# Patient Record
Sex: Male | Born: 1995 | Race: White | Hispanic: No | Marital: Single | Smoking: Former smoker
Health system: Southern US, Community
[De-identification: ages and names within clinical notes are randomized; demographics above are authoritative.]

## PROBLEM LIST (undated history)

## (undated) DIAGNOSIS — J329 Chronic sinusitis, unspecified: Secondary | ICD-10-CM

## (undated) DIAGNOSIS — F988 Other specified behavioral and emotional disorders with onset usually occurring in childhood and adolescence: Secondary | ICD-10-CM

## (undated) DIAGNOSIS — K089 Disorder of teeth and supporting structures, unspecified: Secondary | ICD-10-CM

## (undated) DIAGNOSIS — R519 Headache, unspecified: Secondary | ICD-10-CM

## (undated) DIAGNOSIS — M2420 Disorder of ligament, unspecified site: Secondary | ICD-10-CM

## (undated) DIAGNOSIS — K439 Ventral hernia without obstruction or gangrene: Secondary | ICD-10-CM

## (undated) HISTORY — DX: Other specified behavioral and emotional disorders with onset usually occurring in childhood and adolescence: F98.8

## (undated) HISTORY — PX: MOUTH SURGERY: SHX715

## (undated) HISTORY — DX: Chronic sinusitis, unspecified: J32.9

## (undated) HISTORY — DX: Ventral hernia without obstruction or gangrene: K43.9

## (undated) HISTORY — PX: PR ORAL SURGERY PROCEDURE: D7999

## (undated) HISTORY — DX: Disorder of teeth and supporting structures, unspecified: K08.9

## (undated) HISTORY — DX: Headache, unspecified: R51.9

## (undated) HISTORY — DX: Disorder of ligament, unspecified site: M24.20

## (undated) HISTORY — PX: HERNIA REPAIR: SHX5222

## (undated) DEATH — deceased

---
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
## 2016-10-17 ENCOUNTER — Emergency Department (HOSPITAL_COMMUNITY): Payer: Managed Care, Other (non HMO)

## 2016-10-17 ENCOUNTER — Emergency Department (HOSPITAL_COMMUNITY)
Admission: EM | Admit: 2016-10-17 | Discharge: 2016-10-17 | Disposition: A | Discharge status: Home / Self Care | Payer: Managed Care, Other (non HMO) | Attending: Emergency Medicine | Admitting: Emergency Medicine

## 2016-10-17 ENCOUNTER — Encounter (HOSPITAL_COMMUNITY): Payer: Self-pay | Admitting: Emergency Medicine

## 2016-10-17 DIAGNOSIS — Z79899 Other long term (current) drug therapy: Secondary | ICD-10-CM | POA: Insufficient documentation

## 2016-10-17 DIAGNOSIS — M25511 Pain in right shoulder: Secondary | ICD-10-CM | POA: Diagnosis not present

## 2016-10-17 DIAGNOSIS — M545 Low back pain: Secondary | ICD-10-CM | POA: Insufficient documentation

## 2016-10-17 DIAGNOSIS — Y999 Unspecified external cause status: Secondary | ICD-10-CM | POA: Insufficient documentation

## 2016-10-17 DIAGNOSIS — Z87891 Personal history of nicotine dependence: Secondary | ICD-10-CM | POA: Insufficient documentation

## 2016-10-17 DIAGNOSIS — F10120 Alcohol abuse with intoxication, uncomplicated: Secondary | ICD-10-CM | POA: Diagnosis not present

## 2016-10-17 DIAGNOSIS — Y939 Activity, unspecified: Secondary | ICD-10-CM | POA: Diagnosis not present

## 2016-10-17 DIAGNOSIS — R930 Abnormal findings on diagnostic imaging of skull and head, not elsewhere classified: Secondary | ICD-10-CM | POA: Diagnosis not present

## 2016-10-17 DIAGNOSIS — Y92410 Unspecified street and highway as the place of occurrence of the external cause: Secondary | ICD-10-CM | POA: Insufficient documentation

## 2016-10-17 DIAGNOSIS — Z5181 Encounter for therapeutic drug level monitoring: Secondary | ICD-10-CM | POA: Insufficient documentation

## 2016-10-17 DIAGNOSIS — M25561 Pain in right knee: Secondary | ICD-10-CM | POA: Diagnosis not present

## 2016-10-17 DIAGNOSIS — F10920 Alcohol use, unspecified with intoxication, uncomplicated: Secondary | ICD-10-CM

## 2016-10-17 LAB — I-STAT CHEM 8, ED
BUN: 15 mg/dL (ref 6–20)
BUN: 17 mg/dL (ref 6–20)
CALCIUM ION: 1.13 mmol/L — AB (ref 1.15–1.40)
CHLORIDE: 107 mmol/L (ref 101–111)
CREATININE: 1.2 mg/dL (ref 0.61–1.24)
CREATININE: 1.4 mg/dL — AB (ref 0.61–1.24)
Calcium, Ion: 1.1 mmol/L — ABNORMAL LOW (ref 1.15–1.40)
Chloride: 109 mmol/L (ref 101–111)
GLUCOSE: 102 mg/dL — AB (ref 65–99)
GLUCOSE: 105 mg/dL — AB (ref 65–99)
HCT: 41 % (ref 39.0–52.0)
HEMATOCRIT: 38 % — AB (ref 39.0–52.0)
Hemoglobin: 12.9 g/dL — ABNORMAL LOW (ref 13.0–17.0)
Hemoglobin: 13.9 g/dL (ref 13.0–17.0)
POTASSIUM: 3.9 mmol/L (ref 3.5–5.1)
POTASSIUM: 4.1 mmol/L (ref 3.5–5.1)
Sodium: 145 mmol/L (ref 135–145)
Sodium: 145 mmol/L (ref 135–145)
TCO2: 23 mmol/L (ref 0–100)
TCO2: 24 mmol/L (ref 0–100)

## 2016-10-17 LAB — COMPREHENSIVE METABOLIC PANEL
ALK PHOS: 51 U/L (ref 38–126)
ALT: 15 U/L — AB (ref 17–63)
ANION GAP: 13 (ref 5–15)
AST: 20 U/L (ref 15–41)
Albumin: 4.4 g/dL (ref 3.5–5.0)
BILIRUBIN TOTAL: 0.6 mg/dL (ref 0.3–1.2)
BUN: 14 mg/dL (ref 6–20)
CALCIUM: 9.7 mg/dL (ref 8.9–10.3)
CO2: 21 mmol/L — AB (ref 22–32)
CREATININE: 1.07 mg/dL (ref 0.61–1.24)
Chloride: 109 mmol/L (ref 101–111)
Glucose, Bld: 105 mg/dL — ABNORMAL HIGH (ref 65–99)
Potassium: 4 mmol/L (ref 3.5–5.1)
Sodium: 143 mmol/L (ref 135–145)
TOTAL PROTEIN: 7.3 g/dL (ref 6.5–8.1)

## 2016-10-17 LAB — CBC
HCT: 40.4 % (ref 39.0–52.0)
Hemoglobin: 14.4 g/dL (ref 13.0–17.0)
MCH: 31.3 pg (ref 26.0–34.0)
MCHC: 35.6 g/dL (ref 30.0–36.0)
MCV: 87.8 fL (ref 78.0–100.0)
PLATELETS: 207 10*3/uL (ref 150–400)
RBC: 4.6 MIL/uL (ref 4.22–5.81)
RDW: 12.1 % (ref 11.5–15.5)
WBC: 6.5 10*3/uL (ref 4.0–10.5)

## 2016-10-17 LAB — PROTIME-INR
INR: 1.05
Prothrombin Time: 13.7 seconds (ref 11.4–15.2)

## 2016-10-17 LAB — I-STAT CG4 LACTIC ACID, ED
LACTIC ACID, VENOUS: 2.93 mmol/L — AB (ref 0.5–1.9)
Lactic Acid, Venous: 1.77 mmol/L (ref 0.5–1.9)

## 2016-10-17 LAB — SAMPLE TO BLOOD BANK

## 2016-10-17 LAB — ETHANOL: ALCOHOL ETHYL (B): 234 mg/dL — AB (ref <5)

## 2016-10-17 MED ORDER — SODIUM CHLORIDE 0.9 % IV BOLUS (SEPSIS)
1000.0000 mL | Freq: Once | INTRAVENOUS | Status: AC
  Administered 2016-10-17: 1000 mL via INTRAVENOUS

## 2016-10-17 MED ORDER — IBUPROFEN 800 MG PO TABS: 800.0000 mg | ORAL_TABLET | Freq: Three times a day (TID) | ORAL | 0 refills | Status: DC

## 2016-10-17 MED ORDER — SODIUM CHLORIDE 0.9 % IV SOLN: INTRAVENOUS | Status: DC

## 2016-10-17 MED ORDER — CYCLOBENZAPRINE HCL 10 MG PO TABS: 5.0000 mg | ORAL_TABLET | Freq: Two times a day (BID) | ORAL | 0 refills | Status: DC | PRN
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
## 2016-10-17 NOTE — ED Notes (Signed)
Patient transported to X-ray 
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
## 2018-11-27 ENCOUNTER — Encounter: Payer: Self-pay | Admitting: Family Medicine

## 2018-11-27 NOTE — Progress Notes (Deleted)
   Subjective:    Patient ID: Sean Roberts, male    DOB: 05/05/1996, 23 y.o.   MRN: [id number]  HPI No chief complaint on file.  He is new to the practice and here for a complete physical exam. Previous medical care: Last CPE:  Other providers:  Past medical history: Surgeries:  Family history: Mental health history:  Social history: Lives with ***, works as ***,  *** Smoking, drinking alcohol, drug use Diet: *** Exercise: ***  Immunizations:  Health maintenance:  Colonoscopy: Last PSA: Last Dental Exam: Last Eye Exam:  Wears seatbelt always, uses sunscreen, smoke detectors in home and functioning, does not text while driving, feels safe in home environment.  Reviewed allergies, medications, past medical, surgical, family, and social history.   Review of Systems Review of Systems Constitutional: -fever, -chills, -sweats, -unexpected weight change,-fatigue ENT: -runny nose, -ear pain, -sore throat Cardiology:  -chest pain, -palpitations, -edema Respiratory: -cough, -shortness of breath, -wheezing Gastroenterology: -abdominal pain, -nausea, -vomiting, -diarrhea, -constipation  Hematology: -bleeding or bruising problems Musculoskeletal: -arthralgias, -myalgias, -joint swelling, -back pain Ophthalmology: -vision changes Urology: -dysuria, -difficulty urinating, -hematuria, -urinary frequency, -urgency Neurology: -headache, -weakness, -tingling, -numbness       Objective:   Physical Exam There were no vitals taken for this visit.  General Appearance:    Alert, cooperative, no distress, appears stated age  Head:    Normocephalic, without obvious abnormality, atraumatic  Eyes:    PERRL, conjunctiva/corneas clear, EOM's intact, fundi    benign  Ears:    Normal TM's and external ear canals  Nose:   Nares normal, mucosa normal, no drainage or sinus   tenderness  Throat:   Lips, mucosa, and tongue normal; teeth and gums normal  Neck:   Supple, no lymphadenopathy;   thyroid:  no   enlargement/tenderness/nodules; no carotid   bruit or JVD  Back:    Spine nontender, no curvature, ROM normal, no CVA     tenderness  Lungs:     Clear to auscultation bilaterally without wheezes, rales or     ronchi; respirations unlabored  Chest Wall:    No tenderness or deformity   Heart:    Regular rate and rhythm, S1 and S2 normal, no murmur, rub   or gallop  Breast Exam:    No chest wall tenderness, masses or gynecomastia  Abdomen:     Soft, non-tender, nondistended, normoactive bowel sounds,    no masses, no hepatosplenomegaly  Genitalia:    Normal male external genitalia without lesions.  Testicles without masses.  No inguinal hernias.  Rectal:   Deferred due to age <40 and lack of symptoms  Extremities:   No clubbing, cyanosis or edema  Pulses:   2+ and symmetric all extremities  Skin:   Skin color, texture, turgor normal, no rashes or lesions  Lymph nodes:   Cervical, supraclavicular, and axillary nodes normal  Neurologic:   CNII-XII intact, normal strength, sensation and gait; reflexes 2+ and symmetric throughout          Psych:   Normal mood, affect, hygiene and grooming.          Assessment & Plan:  Routine general medical examination at a health care facility

## 2018-12-04 ENCOUNTER — Encounter: Payer: Self-pay | Admitting: Family Medicine

## 2018-12-04 ENCOUNTER — Ambulatory Visit (INDEPENDENT_AMBULATORY_CARE_PROVIDER_SITE_OTHER): Payer: Managed Care, Other (non HMO) | Admitting: Family Medicine

## 2018-12-04 VITALS — BP 112/76 | HR 80 | Temp 98.0°F | Ht 73.0 in | Wt 152.8 lb

## 2018-12-04 DIAGNOSIS — F988 Other specified behavioral and emotional disorders with onset usually occurring in childhood and adolescence: Secondary | ICD-10-CM | POA: Diagnosis not present

## 2018-12-04 DIAGNOSIS — Z0000 Encounter for general adult medical examination without abnormal findings: Secondary | ICD-10-CM

## 2018-12-04 DIAGNOSIS — M5431 Sciatica, right side: Secondary | ICD-10-CM

## 2018-12-04 DIAGNOSIS — J329 Chronic sinusitis, unspecified: Secondary | ICD-10-CM

## 2018-12-04 LAB — POCT URINALYSIS DIP (PROADVANTAGE DEVICE)
Bilirubin, UA: NEGATIVE
Blood, UA: NEGATIVE
Glucose, UA: NEGATIVE mg/dL
Ketones, POC UA: NEGATIVE mg/dL
Leukocytes, UA: NEGATIVE
Nitrite, UA: NEGATIVE
PH UA: 6 (ref 5.0–8.0)
Protein Ur, POC: NEGATIVE mg/dL
Specific Gravity, Urine: 1.01
Urobilinogen, Ur: 3.5

## 2018-12-04 NOTE — Progress Notes (Signed)
Subjective:    Patient ID: Sean Roberts, male    DOB: 1996-02-28, 23 y.o.   MRN: [id number]  HPI Chief Complaint  Patient presents with  . other    non fasting CPE   He is new to the practice and here for a complete physical exam. Previous medical care: Dr. Abigail Miyamoto at [facility].  Last CPE: 2 years ago  Other providers: Dentist   ADD- diagnosed as a junior in [employer]. Has been taking Adderall 10 mg once daily 2-3 days per week.  No prescriptions in the past year. Only takes it as needed.  He has issues with eating and sleeping.   Takes Claritin in the morning. Afrin some days.   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  Smokes marijuana daily, drinks alcohol.  Diet: fairly healthy  Exercise: 3 days per week  Immunizations: Tdap UTD per patient. Declines flu Gardasil done   Health maintenance:  Last Dental Exam: twice annually  Last Eye Exam: years ago   Wears seatbelt always, uses sunscreen, smoke detectors in home and functioning, does not text while driving, feels safe in home environment.  Reviewed allergies, medications, past medical, surgical, family, and social history.   Review of Systems Review of Systems Constitutional: -fever, -chills, -sweats, -unexpected weight change,-fatigue ENT: -runny nose, -ear pain, -sore throat Cardiology:  -chest pain, -palpitations, -edema Respiratory: -cough, -shortness of breath, -wheezing Gastroenterology: -abdominal pain, -nausea, -vomiting, -diarrhea, -constipation  Hematology: -bleeding or bruising problems Musculoskeletal: -arthralgias, -myalgias, -joint swelling, -back pain Ophthalmology: -vision changes Urology: -dysuria, -difficulty urinating, -hematuria, -urinary frequency, -urgency Neurology: -headache, -weakness, -tingling, -numbness       Objective:   Physical Exam BP 112/76 (BP Location: Left Arm, Patient Position: Sitting)   Pulse 80   Temp 98 F (36.7 C)   Ht 6\' 1"   (1.854 m)   Wt 152 lb 12.8 oz (69.3 kg)   SpO2 97%   BMI 20.16 kg/m   General Appearance:    Alert, cooperative, no distress, appears stated age  Head:    Normocephalic, without obvious abnormality, atraumatic  Eyes:    PERRL, conjunctiva/corneas clear, EOM's intact, fundi    benign  Ears:    Normal TM's and external ear canals  Nose:   Nares normal, mucosa normal, no drainage or sinus   tenderness  Throat:   Lips, mucosa, and tongue normal; teeth and gums normal  Neck:   Supple, no lymphadenopathy;  thyroid:  no   enlargement/tenderness/nodules; no carotid   bruit or JVD  Back:    Spine nontender, no curvature, ROM normal, no CVA     Tenderness.  He does have tenderness over his right sciatic notch.  Lungs:     Clear to auscultation bilaterally without wheezes, rales or     ronchi; respirations unlabored  Chest Wall:    No tenderness or deformity   Heart:    Regular rate and rhythm, S1 and S2 normal, no murmur, rub   or gallop  Breast Exam:    No chest wall tenderness, masses or gynecomastia  Abdomen:     Soft, non-tender, nondistended, normoactive bowel sounds,    no masses, no hepatosplenomegaly  Genitalia:    Normal male external genitalia without lesions.  Testicles without masses.  No inguinal hernias.  Rectal:   Deferred due to age <40 and lack of symptoms  Extremities:   No clubbing, cyanosis or edema  Pulses:   2+ and symmetric all extremities  Skin:   Skin color, texture, turgor normal, no rashes or lesions  Lymph nodes:   Cervical, supraclavicular, and axillary nodes normal  Neurologic:   CNII-XII intact, normal strength, sensation and gait; reflexes 2+ and symmetric throughout          Psych:   Normal mood, affect, hygiene and grooming.     Urinalysis dipstick: Negative      Assessment & Plan:  Routine general medical examination at a health care facility  Attention deficit disorder, unspecified hyperactivity presence  Frequent sinus infections  Sciatica of  right side  Is a pleasant 23 year old male who is new to the practice and here today for a nonfasting CPE.  He declines labs. Counseling on exercises and conservative treatment for sciatica.  He will report back in a couple of weeks if he is not noticing any improvement.  Consider referral to PT at that time. History of ADD.  No records available today to confirm this.  He does not currently need refills.  I will get records from his previous PCP and potentially the counselor who diagnosed him. Immunizations up-to-date per patient.  Nothing on file. He appears to be taking good care of himself by eating a healthy diet and exercising.  I did recommend that he stop smoking marijuana. Declines STD testing. Congratulated him on upcoming graduation in May.  He already has a job in Chief Financial Officer which he is excited about. Recommend self testicular exams and counseling on his increased risk of testicular cancer due to age. Follow-up as needed.

## 2018-12-04 NOTE — Addendum Note (Signed)
Addended by: Renelda Loma on: 12/04/2018 05:38 PM   Modules accepted: Orders

## 2018-12-04 NOTE — Patient Instructions (Addendum)
Try Compound W for the wart on your finger.    Core Strength Exercises  Core exercises help to build strength in the muscles between your ribs and your hips (abdominal muscles). These muscles help to support your body and keep your spine stable. It is important to maintain strength in your core to prevent injury and pain. Some activities, such as yoga and Pilates, can help to strengthen core muscles. You can also strengthen core muscles with exercises at home. It is important to talk to your health care provider before you start a new exercise routine. What are the benefits of core strength exercises? Core strength exercises can:  Reduce back pain.  Help to rebuild strength after a back or spine injury.  Help to prevent injury during physical activity, especially injuries to the back and knees. How to do core strength exercises Repeat these exercises 10-15 times, or until you are tired. Do exercises exactly as told by your health care provider and adjust them as directed. It is normal to feel mild stretching, pulling, tightness, or discomfort as you do these exercises. If you feel any pain while doing these exercises, stop. If your pain continues or gets worse when doing core exercises, contact your health care provider. You may want to use a padded yoga or exercise mat for strength exercises that are done on the floor. Bridging  1. Lie on your back on a firm surface with your knees bent and your feet flat on the floor. 2. Raise your hips so that your knees, hips, and shoulders form a straight line together. Keep your abdominal muscles tight. 3. Hold this position for 3-5 seconds. 4. Slowly lower your hips to the starting position. 5. Let your muscles relax completely between repetitions. Single-leg bridge 1. Lie on your back on a firm surface with your knees bent and your feet flat on the floor. 2. Raise your hips so that your knees, hips, and shoulders form a straight line together. Keep  your abdominal muscles tight. 3. Lift one foot off the floor, then completely straighten that leg. 4. Hold this position for 3-5 seconds. 5. Put the straight leg back down in the bent position. 6. Slowly lower your hips to the starting position. 7. Repeat these steps using your other leg. Side bridge 1. Lie on your side with your knees bent. Prop yourself up on the elbow that is near the floor. 2. Using your abdominal muscles and your elbow that is on the floor, raise your body off the floor. Raise your hip so that your shoulder, hip, and foot form a straight line together. 3. Hold this position for 10 seconds. Keep your head and neck raised and away from your shoulder (in their normal, neutral position). Keep your abdominal muscles tight. 4. Slowly lower your hip to the starting position. 5. Repeat and try to hold this position longer, working your way up to 30 seconds. Abdominal crunch 1. Lie on your back on a firm surface. Bend your knees and keep your feet flat on the floor. 2. Cross your arms over your chest. 3. Without bending your neck, tip your chin slightly toward your chest. 4. Tighten your abdominal muscles as you lift your chest just high enough to lift your shoulder blades off of the floor. Do not hold your breath. You can do this with short lifts or long lifts. 5. Slowly return to the starting position. Bird dog 1. Get on your hands and knees, with your legs shoulder-width apart  and your arms under your shoulders. Keep your back straight. 2. Tighten your abdominal muscles. 3. Raise one of your legs off the floor and straighten it. Try to keep it parallel to the floor. 4. Slowly lower your leg to the starting position. 5. Raise one of your arms off the floor and straighten it. Try to keep it parallel to the floor. 6. Slowly lower your arm to the starting position. 7. Repeat with the other arm and leg. If possible, try raising a leg and arm at the same time, on opposite sides of  the body. For example, raise your left hand and your right leg. Plank 1. Lie on your belly. 2. Prop up your body onto your forearms and your feet, keeping your legs straight. Your body should make a straight line between your shoulders and feet. 3. Hold this position for 10 seconds while keeping your abdominal muscles tight. 4. Lower your body to the starting position. 5. Repeat and try to hold this position longer, working your way up to 30 seconds. Cross-core strengthening 1. Stand with your feet shoulder-width apart. 2. Hold a ball out in front of you. Keep your arms straight. 3. Tighten your abdominal muscles and slowly rotate at your waist from side to side. Keep your feet flat. 4. Once you are comfortable, try repeating this exercise with a heavier ball. Top core strengthening 1. Stand about 18 inches (46 cm) in front of a wall, with your back to the wall. 2. Keep your feet flat and shoulder-width apart. 3. Tighten your abdominal muscles. 4. Bend your hips and knees. 5. Slowly reach between your legs to touch the wall behind you. 6. Slowly stand back up. 7. Raise your arms over your head and reach behind you. 8. Return to the starting position. General tips  Do not do any exercises that cause pain. If you have pain while exercising, talk to your health care provider.  Always stretch before and after doing these exercises. This can help prevent injury.  Maintain a healthy weight. Ask your health care provider what weight is healthy for you. Contact a health care provider if:  You have back pain that gets worse or does not go away.  You feel pain while doing core strength exercises. Get help right away if:  You have severe pain that does not get better with medicine. Summary  Core exercises help to build strength in the muscles between your ribs and your waist.  Core muscles help to support your body and keep your spine stable.  Some activities, such as yoga and Pilates,  can help to strengthen core muscles.  Core strength exercises can help back pain and can prevent injury.  If you feel any pain while doing core strength exercises, stop. This information is not intended to replace advice given to you by your health care provider. Make sure you discuss any questions you have with your health care provider. Document Released: 03/05/2017 Document Revised: 03/05/2017 Document Reviewed: 03/05/2017 Elsevier Interactive Patient Education  2019 Else[employer] Inc.    Sciatica  Sciatica is pain, numbness, weakness, or tingling along your sciatic nerve. The sciatic nerve starts in the lower back and goes down the back of each leg. Sciatica happens when this nerve is pinched or has pressure put on it. Sciatica usually goes away on its own or with treatment. Sometimes, sciatica may keep coming back (recur). Follow these instructions at home: Medicines  Take over-the-counter and prescription medicines only as told by your  doctor.  Do not drive or use heavy machinery while taking prescription pain medicine. Managing pain  If directed, put ice on the affected area. ? Put ice in a plastic bag. ? Place a towel between your skin and the bag. ? Leave the ice on for 20 minutes, 2-3 times a day.  After icing, apply heat to the affected area before you exercise or as often as told by your doctor. Use the heat source that your doctor tells you to use, such as a moist heat pack or a heating pad. ? Place a towel between your skin and the heat source. ? Leave the heat on for 20-30 minutes. ? Remove the heat if your skin turns bright red. This is especially important if you are unable to feel pain, heat, or cold. You may have a greater risk of getting burned. Activity  Return to your normal activities as told by your doctor. Ask your doctor what activities are safe for you. ? Avoid activities that make your sciatica worse.  Take short rests during the day. Rest in a lying or  standing position. This is usually better than sitting to rest. ? When you rest for a long time, do some physical activity or stretching between periods of rest. ? Avoid sitting for a long time without moving. Get up and move around at least one time each hour.  Exercise and stretch regularly, as told by your doctor.  Do not lift anything that is heavier than 10 lb (4.5 kg) while you have symptoms of sciatica. ? Avoid lifting heavy things even when you do not have symptoms. ? Avoid lifting heavy things over and over.  When you lift objects, always lift in a way that is safe for your body. To do this, you should: ? Bend your knees. ? Keep the object close to your body. ? Avoid twisting. General instructions  Use good posture. ? Avoid leaning forward when you are sitting. ? Avoid hunching over when you are standing.  Stay at a healthy weight.  Wear comfortable shoes that support your feet. Avoid wearing high heels.  Avoid sleeping on a mattress that is too soft or too hard. You might have less pain if you sleep on a mattress that is firm enough to support your back.  Keep all follow-up visits as told by your doctor. This is important. Contact a doctor if:  You have pain that: ? Wakes you up when you are sleeping. ? Gets worse when you lie down. ? Is worse than the pain you have had in the past. ? Lasts longer than 4 weeks.  You lose weight for without trying. Get help right away if:  You cannot control when you pee (urinate) or poop (have a bowel movement).  You have weakness in any of these areas and it gets worse. ? Lower back. ? Lower belly (pelvis). ? Butt (buttocks). ? Legs.  You have redness or swelling of your back.  You have a burning feeling when you pee. This information is not intended to replace advice given to you by your health care provider. Make sure you discuss any questions you have with your health care provider. Document Released: 07/23/2008  Document Revised: 03/21/2016 Document Reviewed: 06/23/2015 Elsevier Interactive Patient Education  2019 [place] 18-39 Years, Male Preventive care refers to lifestyle choices and visits with your health care provider that can promote health and wellness. What does preventive care include?  A yearly physical exam. This is also called an annual well check.  Dental exams once or twice a year.  Routine eye exams. Ask your health care provider how often you should have your eyes checked.  Personal lifestyle choices, including: ? Daily care of your teeth and gums. ? Regular physical activity. ? Eating a healthy diet. ? Avoiding tobacco and drug use. ? Limiting alcohol use. ? Practicing safe sex. What happens during an annual well check? The services and screenings done by your health care provider during your annual well check will depend on your age, overall health, lifestyle risk factors, and family history of disease. Counseling Your health care provider may ask you questions about your:  Alcohol use.  Tobacco use.  Drug use.  Emotional well-being.  Home and relationship well-being.  Sexual activity.  Eating habits.  Work and work Statistician. Screening You may have the following tests or measurements:  Height, weight, and BMI.  Blood pressure.  Lipid and cholesterol levels. These may be checked every 5 years starting at age 55.  Diabetes screening. This is done by checking your blood sugar (glucose) after you have not eaten for a while (fasting).  Skin check.  Hepatitis C blood test.  Hepatitis B blood test.  Sexually transmitted disease (STD) testing. Discuss your test results, treatment options, and if necessary, the need for more tests with your health care provider. Vaccines Your health care provider may recommend certain vaccines, such as:  Influenza vaccine. This is recommended every year.  Tetanus, diphtheria, and  acellular pertussis (Tdap, Td) vaccine. You may need a Td booster every 10 years.  Varicella vaccine. You may need this if you have not been vaccinated.  HPV vaccine. If you are 33 or younger, you may need three doses over 6 months.  Measles, mumps, and rubella (MMR) vaccine. You may need at least one dose of MMR.You may also need a second dose.  Pneumococcal 13-valent conjugate (PCV13) vaccine. You may need this if you have certain conditions and have not been vaccinated.  Pneumococcal polysaccharide (PPSV23) vaccine. You may need one or two doses if you smoke cigarettes or if you have certain conditions.  Meningococcal vaccine. One dose is recommended if you are age 27-21 years and a first-year college student living in a residence hall, or if you have one of several medical conditions. You may also need additional booster doses.  Hepatitis A vaccine. You may need this if you have certain conditions or if you travel or work in places where you may be exposed to hepatitis A.  Hepatitis B vaccine. You may need this if you have certain conditions or if you travel or work in places where you may be exposed to hepatitis B.  Haemophilus influenzae type b (Hib) vaccine. You may need this if you have certain risk factors. Talk to your health care provider about which screenings and vaccines you need and how often you need them. This information is not intended to replace advice given to you by your health care provider. Make sure you discuss any questions you have with your health care provider. Document Released: 12/10/2001 Document Revised: 05/27/2017 Document Reviewed: 08/15/2015 Elsevier Interactive Patient Education  2019 [employer].

## 2019-01-06 ENCOUNTER — Telehealth: Payer: Self-pay | Admitting: Family Medicine

## 2019-01-06 NOTE — Telephone Encounter (Signed)
Received requested records from [facility] on behalf of [facility]. Sending back for review.

## 2019-10-18 ENCOUNTER — Other Ambulatory Visit: Payer: Self-pay

## 2019-10-18 ENCOUNTER — Encounter: Payer: Self-pay | Admitting: Family Medicine

## 2019-10-18 ENCOUNTER — Ambulatory Visit (INDEPENDENT_AMBULATORY_CARE_PROVIDER_SITE_OTHER): Payer: Self-pay | Admitting: Family Medicine

## 2019-10-18 VITALS — BP 120/78 | HR 65 | Temp 97.8°F | Wt 162.8 lb

## 2019-10-18 DIAGNOSIS — M545 Low back pain: Secondary | ICD-10-CM | POA: Diagnosis not present

## 2019-10-18 DIAGNOSIS — L853 Xerosis cutis: Secondary | ICD-10-CM | POA: Diagnosis not present

## 2019-10-18 DIAGNOSIS — G8929 Other chronic pain: Secondary | ICD-10-CM | POA: Diagnosis not present

## 2019-10-18 NOTE — Progress Notes (Signed)
   Subjective:    Patient ID: Sean Roberts, male    DOB: 1995-12-09, 23 y.o.   MRN: [id number]  HPI Chief Complaint  Patient presents with  . rash and back pain    rash on elbows- itching.  been putting cortosine. having back pain- been going on a while   Complains of dry rash on his elbows which has been slightly pruritic.  States he used a topical steroid and symptoms have basically resolved. Denies rash anywhere else. Denies elbow pain.  He also complains of chronic intermittent low back pain for the past couple of years.  States for the past 2 months his right low back has been hurting more often.  Pain is described as a dull ache.  Occasional radiation into his right buttock.  Pain is worse with certain movements.  Last week he was awakened with pain. Denies injury.  States he knows his posture is bad and he has been sitting more lately at work. Reports taking ibuprofen 400 mg to 800 mg as needed  States he schedule an appointment with a chiropractor - Dean Mylar for next week.  Denies fever, chills, chest pain, palpitations, shortness of breath, cough, abdominal pain, nausea, vomiting, diarrhea. Denies numbness, tingling, weakness.  Reviewed allergies, medications, past medical, surgical, family, and social history.    Review of Systems Pertinent positives and negatives in the history of present illness.     Objective:   Physical Exam BP 120/78   Pulse 65   Temp 97.8 F (36.6 C)   Wt 162 lb 12.8 oz (73.8 kg)   BMI 21.48 kg/m  Alert and oriented and in no acute distress. Cardiac exam shows a regular sinus rhythm without murmurs or gallops. Lungs are clear to auscultation.  Back with good sensation and motion.  Right lumbar para spinal pain with extreme flexion, extension.  Negative straight leg raise.  DTRs are symmetric and normal.  Skin is warm and dry.  Elbows without any significant dryness or scaling or thickening of the skin       Assessment & Plan:  Dry  skin -This is basically back to baseline.  No other rashes or arthralgias suggesting systemic issue.  Recommend frequently using a good moisturizer.  Advised to not use topical steroid more than 1 to 2 weeks at most.  Chronic right-sided low back pain without sciatica-this is an ongoing issue in which he has intermittent flareups.  Discussed that his flexibility is not very good.  Recommend conservative treatment with heat, topical analgesic and gentle stretches.  A handout was provided with back exercises.  Discussed improving core strength and using good body mechanics.  He plans to see a chiropractor next week which is fine.  Discussed that if he would like to have a referral to physical therapy, he may call back.

## 2019-10-18 NOTE — Patient Instructions (Signed)
Use a good moisturizer on your elbows and let me know if this problem flares up again. Take a photo if it does.  Do not use a topical steroid for longer than 1-2 weeks at most.   For your back: use heat and a topical pain medication such as BioFreeze or Salon Pas.  Do gentle stretches.  See the chiropractor as scheduled and if you decide you would like to see a physical therapist, let me know.     Back Exercises The following exercises strengthen the muscles that help to support the trunk and back. They also help to keep the lower back flexible. Doing these exercises can help to prevent back pain or lessen existing pain.  If you have back pain or discomfort, try doing these exercises 2-3 times each day or as told by your health care provider.  As your pain improves, do them once each day, but increase the number of times that you repeat the steps for each exercise (do more repetitions).  To prevent the recurrence of back pain, continue to do these exercises once each day or as told by your health care provider. Do exercises exactly as told by your health care provider and adjust them as directed. It is normal to feel mild stretching, pulling, tightness, or discomfort as you do these exercises, but you should stop right away if you feel sudden pain or your pain gets worse. Exercises Single knee to chest Repeat these steps 3-5 times for each leg: 1. Lie on your back on a firm bed or the floor with your legs extended. 2. Bring one knee to your chest. Your other leg should stay extended and in contact with the floor. 3. Hold your knee in place by grabbing your knee or thigh with both hands and hold. 4. Pull on your knee until you feel a gentle stretch in your lower back or buttocks. 5. Hold the stretch for 10-30 seconds. 6. Slowly release and straighten your leg. Pelvic tilt Repeat these steps 5-10 times: 1. Lie on your back on a firm bed or the floor with your legs extended. 2. Bend your  knees so they are pointing toward the ceiling and your feet are flat on the floor. 3. Tighten your lower abdominal muscles to press your lower back against the floor. This motion will tilt your pelvis so your tailbone points up toward the ceiling instead of pointing to your feet or the floor. 4. With gentle tension and even breathing, hold this position for 5-10 seconds. Cat-cow Repeat these steps until your lower back becomes more flexible: 1. Get into a hands-and-knees position on a firm surface. Keep your hands under your shoulders, and keep your knees under your hips. You may place padding under your knees for comfort. 2. Let your head hang down toward your chest. Contract your abdominal muscles and point your tailbone toward the floor so your lower back becomes rounded like the back of a cat. 3. Hold this position for 5 seconds. 4. Slowly lift your head, let your abdominal muscles relax and point your tailbone up toward the ceiling so your back forms a sagging arch like the back of a cow. 5. Hold this position for 5 seconds.  Press-ups Repeat these steps 5-10 times: 1. Lie on your abdomen (face-down) on the floor. 2. Place your palms near your head, about shoulder-width apart. 3. Keeping your back as relaxed as possible and keeping your hips on the floor, slowly straighten your arms to raise  the top half of your body and lift your shoulders. Do not use your back muscles to raise your upper torso. You may adjust the placement of your hands to make yourself more comfortable. 4. Hold this position for 5 seconds while you keep your back relaxed. 5. Slowly return to lying flat on the floor.  Bridges Repeat these steps 10 times: 1. Lie on your back on a firm surface. 2. Bend your knees so they are pointing toward the ceiling and your feet are flat on the floor. Your arms should be flat at your sides, next to your body. 3. Tighten your buttocks muscles and lift your buttocks off the floor until  your waist is at almost the same height as your knees. You should feel the muscles working in your buttocks and the back of your thighs. If you do not feel these muscles, slide your feet 1-2 inches farther away from your buttocks. 4. Hold this position for 3-5 seconds. 5. Slowly lower your hips to the starting position, and allow your buttocks muscles to relax completely. If this exercise is too easy, try doing it with your arms crossed over your chest. Abdominal crunches Repeat these steps 5-10 times: 1. Lie on your back on a firm bed or the floor with your legs extended. 2. Bend your knees so they are pointing toward the ceiling and your feet are flat on the floor. 3. Cross your arms over your chest. 4. Tip your chin slightly toward your chest without bending your neck. 5. Tighten your abdominal muscles and slowly raise your trunk (torso) high enough to lift your shoulder blades a tiny bit off the floor. Avoid raising your torso higher than that because it can put too much stress on your low back and does not help to strengthen your abdominal muscles. 6. Slowly return to your starting position. Back lifts Repeat these steps 5-10 times: 1. Lie on your abdomen (face-down) with your arms at your sides, and rest your forehead on the floor. 2. Tighten the muscles in your legs and your buttocks. 3. Slowly lift your chest off the floor while you keep your hips pressed to the floor. Keep the back of your head in line with the curve in your back. Your eyes should be looking at the floor. 4. Hold this position for 3-5 seconds. 5. Slowly return to your starting position. Contact a health care provider if:  Your back pain or discomfort gets much worse when you do an exercise.  Your worsening back pain or discomfort does not lessen within 2 hours after you exercise. If you have any of these problems, stop doing these exercises right away. Do not do them again unless your health care provider says that  you can. Get help right away if:  You develop sudden, severe back pain. If this happens, stop doing the exercises right away. Do not do them again unless your health care provider says that you can. This information is not intended to replace advice given to you by your health care provider. Make sure you discuss any questions you have with your health care provider. Document Released: 11/21/2004 Document Revised: 02/18/2019 Document Reviewed: 07/16/2018 Elsevier Patient Education  2020 [employer].

## 2019-12-16 ENCOUNTER — Encounter: Payer: Managed Care, Other (non HMO) | Admitting: Family Medicine

## 2020-01-24 ENCOUNTER — Encounter: Payer: Self-pay | Admitting: Family Medicine

## 2020-02-01 ENCOUNTER — Encounter: Payer: Self-pay | Admitting: Family Medicine

## 2020-04-28 ENCOUNTER — Ambulatory Visit
Admission: RE | Admit: 2020-04-28 | Discharge: 2020-04-28 | Disposition: A | Discharge status: Home / Self Care | Payer: Managed Care, Other (non HMO) | Source: Ambulatory Visit | Attending: Family Medicine | Admitting: Family Medicine

## 2020-04-28 ENCOUNTER — Other Ambulatory Visit: Payer: Self-pay

## 2020-04-28 ENCOUNTER — Ambulatory Visit (INDEPENDENT_AMBULATORY_CARE_PROVIDER_SITE_OTHER): Payer: Self-pay | Admitting: Family Medicine

## 2020-04-28 ENCOUNTER — Encounter: Payer: Self-pay | Admitting: Family Medicine

## 2020-04-28 VITALS — BP 120/80 | HR 89 | Temp 98.1°F | Wt 158.8 lb

## 2020-04-28 DIAGNOSIS — S61431A Puncture wound without foreign body of right hand, initial encounter: Secondary | ICD-10-CM

## 2020-04-28 DIAGNOSIS — Y9389 Activity, other specified: Secondary | ICD-10-CM

## 2020-04-28 MED ORDER — AMOXICILLIN-POT CLAVULANATE 875-125 MG PO TABS: 1.0000 | ORAL_TABLET | Freq: Two times a day (BID) | ORAL | 0 refills | Status: AC

## 2020-04-28 NOTE — Patient Instructions (Signed)
Go to [facility] imaging for your hand x-ray.  Continue soaking your hand in warm water 2-3 times per day for the next 2 days.  If you notice any pus, increased redness, warmth, swelling or hand tenderness you can start the antibiotic.  If you have signs of a severe infection, I recommend that you seek immediate medical treatment.  If you were to see infection spread to the palm of your hand at any point, you would need to be seen right away.

## 2020-04-28 NOTE — Progress Notes (Signed)
   Subjective:    Patient ID: Sean Roberts, male    DOB: 13-Jul-1996, 24 y.o.   MRN: [id number]  HPI Chief Complaint  Patient presents with  . puncture wound    puncture wound by the catfish from the spine of the fish.    Here with complaints of a fin of a catfish puncturing the top of his right hand yesterday[place] out the wound but had severe pain.  States he soaked his hand in hot water and applied neosporin and band aid.   Denies redness, pus. States he had more swelling around the wound yesterday. No longer swollen.  States he has good ROM of his hand.   No fever, chills, N/V.   Tdap is UTD in the past 2 years.    Review of Systems Pertinent positives and negatives in the history of present illness.     Objective:   Physical Exam Constitutional:      General: He is not in acute distress.    Appearance: Normal appearance. He is not ill-appearing.  Musculoskeletal:     Right forearm: Normal.     Right wrist: Normal.     Right hand: Tenderness present. No swelling or bony tenderness. Normal range of motion. Normal strength. Normal sensation. Normal capillary refill. Normal pulse.     Left hand: Normal.       Hands:     Comments: Puncture wound to dorsum of left hand. Irrigated and cleaned. No foreign body observed. No surrounding erythema, induration, fluctuance or exudate. Band aid applied  Skin:    General: Skin is warm and dry.     Capillary Refill: Capillary refill takes less than 2 seconds.  Neurological:     Mental Status: He is alert and oriented to person, place, and time.     Sensory: Sensation is intact.     Motor: Motor function is intact.    BP 120/80   Pulse 89   Temp 98.1 F (36.7 C)   Wt 158 lb 12.8 oz (72 kg)   SpO2 98%   BMI 20.95 kg/m       Assessment & Plan:  Puncture wound of right hand, foreign body presence unspecified, initial encounter - Plan: DG Hand Complete Right,  amoxicillin-clavulanate (AUGMENTIN) 875-125 MG tablet  Injury while fishing - Plan: DG Hand Complete Right, amoxicillin-clavulanate (AUGMENTIN) 875-125 MG tablet  The wound was irrigated and no foreign body was visualized.  I will send him for an x-ray. No sign of infection but due to this being a injury from a catfish, I will cover him with Augmentin.  Encouraged him to keep a close eye on the site and take a picture of it daily.  Discussed red flag symptoms such as surrounding erythema that spreads, especially to the palm of his hand, worsening tenderness especially tenderness of the joint, fever, chills, nausea or vomiting.  He is aware that he will need to be seen right away and not wait until our office opens next week since this is a long holiday weekend.  He may take Tylenol or ibuprofen for pain.  Continue with warm soaks.

## 2020-05-01 NOTE — Progress Notes (Signed)
His X ray showed some mild soft tissue swelling but otherwise unremarkable. Please ask him how his hand is doing and if he is taking the antibiotic or not.

## 2020-05-03 ENCOUNTER — Ambulatory Visit: Payer: Managed Care, Other (non HMO) | Admitting: Family Medicine

## 2021-07-18 ENCOUNTER — Other Ambulatory Visit: Payer: Self-pay

## 2021-07-18 ENCOUNTER — Emergency Department
Admission: EM | Admit: 2021-07-18 | Discharge: 2021-07-18 | Discharge status: Against Medical Advice | Payer: 59 | Attending: Emergency Medicine | Admitting: Emergency Medicine

## 2021-07-18 DIAGNOSIS — N50811 Right testicular pain: Secondary | ICD-10-CM | POA: Insufficient documentation

## 2021-07-18 NOTE — Progress Notes (Signed)
NA [id number]

## 2021-07-18 NOTE — ED Triage Notes (Signed)
Pt reports pain and swelling to right testicle, onset about 4 days ago. He denies any urinary symptoms

## 2021-07-19 ENCOUNTER — Ambulatory Visit (INDEPENDENT_AMBULATORY_CARE_PROVIDER_SITE_OTHER): Payer: 59 | Admitting: Family Medicine

## 2021-07-19 VITALS — BP 98/72 | HR 78 | Temp 98.3°F | Resp 18

## 2021-07-19 DIAGNOSIS — K4020 Bilateral inguinal hernia, without obstruction or gangrene, not specified as recurrent: Secondary | ICD-10-CM

## 2021-07-19 DIAGNOSIS — N451 Epididymitis: Secondary | ICD-10-CM

## 2021-07-19 MED ORDER — DOXYCYCLINE MONOHYDRATE 100 MG OR CAPS
100.0000 mg | ORAL_CAPSULE | Freq: Two times a day (BID) | ORAL | 0 refills | Status: AC
Start: 2021-07-19 — End: 2021-07-26

## 2021-07-19 NOTE — Patient Instructions (Addendum)
You have findings consistent with epididymitis.  Use the antibiotics as we discussed.  Taking NSAIDs such as ibuprofen can also help.    You also have a hernia.  This may be responsible for some of your symptoms.  I have given you a referral to the surgery group at [facility].      Please see the urologist if your testicular pain persists.    Franchot Gallo, M.D.  Clinical Assistant Professor  Department of Family Medicine  [place] [facility]

## 2021-07-19 NOTE — Progress Notes (Signed)
The following encounter was generated from the [facility].    ID/CC: 25 yo male with right testicular pain.      HPI: he has had persistent pain, right testicle for almost a week.  No h/o trauma.  It is a little worse when he strains to urinate, but no problems with urination in general.  No STD concerns.  No pain on the left side.  Pain is a little worse if he jostles the area.    No nausea, vomiting, abdominal pain or other GI complaints   No fevers    He is pretty active: hiking, going to the gym.  Wonders if those are factors.     Soc hx: he is in [employer].  Going on a motorcycle trip to [place] in about 3 weeks.      No outpatient medications prior to visit.     No facility-administered medications prior to visit.     Review of patient's allergies indicates:  No Known Allergies      Exam:  BP 98/72    Pulse 78    Temp 36.8 C (Temporal)    Resp 18    SpO2 99%   General appearance: he is in NAD  GU: there is mild tenderness around the superior aspect of the right testicle and the distal portion of the spermatic cord.  Left side is non-tender.  No testicular masses either side.  He has bilateral inguinal hernias and the one on the right is tender, more so when he performs a valsalva maneuver.      Labs/studies:  None     Assessment:    1.  Patient with right sided testicular pain which appears to be due to early epididymitis.    2.  Bilateral inguinal hernias.  The one on the right is symptomatic and this could be part of the reason for the testicular pain.  Should have evaluation by surgery, certainly before a big trip to [place].      Plan:    1.  Discussed findings and impressions with patient.  2.  Doxycycline 100 mg bid, 7 days.  3.  Advised over the counter NSAIDs as well.  4.  Referred to hernia group at [facility].  5.  I told him that we are not sure if the hernias are the entire reason for the pain, hence the antibiotics.  However, if testicular pain persists, needs to  see urology.  Gave referral to central urology.      Franchot Gallo, M.D.  Clinical Assistant Professor  Department of Family Medicine  [place] [facility]

## 2021-07-26 ENCOUNTER — Ambulatory Visit (INDEPENDENT_AMBULATORY_CARE_PROVIDER_SITE_OTHER): Payer: 59 | Admitting: Surgery

## 2021-07-26 ENCOUNTER — Encounter (INDEPENDENT_AMBULATORY_CARE_PROVIDER_SITE_OTHER): Payer: Self-pay | Admitting: Surgery

## 2021-07-26 VITALS — BP 81/34 | HR 79 | Temp 97.9°F | Ht 75.0 in | Wt 160.0 lb

## 2021-07-26 DIAGNOSIS — K4020 Bilateral inguinal hernia, without obstruction or gangrene, not specified as recurrent: Secondary | ICD-10-CM | POA: Insufficient documentation

## 2021-07-26 NOTE — Patient Instructions (Signed)
Thank you for visiting [place] in the [place] [facility].     We appreciate you choosing [place] for your surgical care, and we are looking forward to seeing you again in the near future.    We are planning to schedule you for robotic assisted laparoscopic bilateral inguinal hernia repair with Dr. Ophelia Charter.    Prior to leaving clinic today, you should have spoken with our clinic nurse who provided you with preoperative teaching for your surgery.    You will receive a phone call from one of our surgery schedulers to set the date of your surgery. If you do not hear from our schedulers in the next 48 hours, please call our clinic to set a surgery date: [phone].    You will need to undergo the following testing prior to the date of your surgery:   - None    If you need to undergo any additional testing (noted above) prior to your surgery, you will be contacted in the near future to set up those appointments.    We discussed the signs and symptoms of acute hernia incarceration.  Please remember that these symptoms can include a sudden worsening of pain at the hernia site, a bulge at the hernia site that cannot be reduced, or any symptoms of a bowel obstruction including nausea, vomiting, or lack of bowel function.  If you develop any of these symptoms, you should be seen immediately in person by a physician or other healthcare provider who can assess the patient for possible incarcerated hernia and recommend appropriate treatment.  Although rare, an acutely incarcerated hernia can be a life-threatening condition and warrants immediate evaluation.      Abdominal Core Surgery Rehabilitation    Please visit:  [email or web address] for the full pre- and post-operative core rehabilitation protocols    After undergoing abdominal wall reconstruction or hernia repair, your body has a new ability to stabilize its core. Rehabilitation of your core after these  operations is important to maintain function and flexibility and can help reduce pain. These guides will help you regain function and restore abdominal core health. If you have not already done so, you should consult with your surgeon before you start the program described in these guides.    Some of the topics covered include:    Self-Care for Healing and Recovery  After your surgery, your abdominal tissues will continue healing for several months. The first four weeks are when you are most susceptible to re-injury. This section includes information to help you protect your hernia repair and reduce your risk of developing health problems during your recovery.    Stretching  Stretching helps reduce discomfort, improve your blood circulation, and maintain abdominal core movement. It also helps reduce scarring and scar tissue. This section includes stretches with tips to protect your hernia repair.    Activities of Daily Living  Basic activities are usually more difficult immediately after surgery. This section includes techniques to protect your hernia repair during routine daily activities such as sleeping, walking, etc.    Exercises  Developing weakness after surgery is very common and can prolong your recovery. This section contains exercises that will help you reduce strength loss while avoiding excess stress to your hernia repair.          Hernia Repair and Your Physical Activity    One of the most common topics of conversation when it comes to recovery after hernia surgery is patient physical activity.  And one of the most common questions that patients ask is, "What are my physical activity restrictions after surgery?"    I try to strike a balance between being slightly protective to allow the hernia repair site to heal appropriately and not being overly restrictive to physical activity.     Although every patient is an individual and may require slight adjustment to the following guidelines, here is my  typical approach to postoperative physical activity during recovery from outpatient/overnight stay hernia surgery:    The first week to 10 days after surgery  During this time, you won't feel like doing much elective physical activity at all. You should be able to walk around the house/apartment and climb stairs. This period of time is an opportunity to rest/relax and not do more physical activity than you need to to get through your day. You should be able to do your activities of daily living (bathe, toilet, cook, eat, etc) with minimal to no help.    10 days to 3 weeks after surgery  During this time you should begin to feel quite a bit better than that first week after surgery. You may feel like going outside for a walk, maybe even get back on the treadmill, elliptical, or exercise bike. These activities are fine, as long as you begin them slowly and do not push yourself physically. This is a time to get a sense of how you are going to feel slowly incorporating what you enjoy doing back into your life after surgery.    3-4 weeks after surgery  I will plan to see you back in the office for your postoperative appointment. Assuming that your recovery is going well and there are no concerns about how you are healing, I will generally recommend that you being to advance your physical activity back towards 100%. That said, if you are only at 20% (or 30%, or 40%) of your normal activity, I don't want you jumping right back to 100%. If you are used to lifting 100 lbs but haven't lifted more than 20 lbs since surgery, don't make the next thing you lift be 100 lbs. So, the key is to go back slowly with small incremental increases in your activity. Shoot to be back to 100% by 6-8 weeks. This time varies depending on your preoperative level of activity -- some patients may be back to 100% by 4 weeks, others may take 8-10 weeks (depending on how active they are prior to surgery).    Until your postoperative appointment, I  would like you to avoid lifting anything greater than 20 lbs and refrain from any significant lifting, pushing, pulling, straining, and strenuous core abdominal exercises to avoid putting stress/strain on the abdominal wall and hernia repair site.      Please Refer to [facility]) Rehabilitation Protocol    [email or web address]      A couple final thoughts:    - Listen to your body -- if something doesn't feel quite right or causes moderate/severe discomfort, then avoid that activity and give yourself some additional recovery prior to trying that activity again.    - Go Slowly -- if there is a common theme to postoperative physical activity, it's don't push yourself in the first 1-2 months. Slowly increase your physical activity intensity back to your 100%. Shoot for being back to 100% somewhere between 4 and 8 weeks (though this will vary greatly based on what your baseline level of function/activity is prior  to surgery).          Optimal Postoperative Pain Management Strategies after Hernia Repair    1. During the first 24 hours after your surgery, you can place an ice pack over your surgical  region. This can help reduce swelling and discomfort.    2. Take 2-3 tablets of Tylenol 325 mg (or generic acetaminophen) every 6 hours for 3 days,  then only as needed to control discomfort. [DO NOT exceed 4000 mg in a 24-hour  period, as this can be damaging to your liver]    3. Take 2-3 tablets of Advil or Motrin 200mg (or generic ibuprofen) every 6 hours with  food or milk for 3 days, then only as needed to control discomfort. [DO NOT use  Advil/Motrin if you have a history of stomach or intestinal ulcers or have had problems  taking aspirin in the past]    4. You may stagger Tylenol and Advil so that you are taking something every three hours,  or you may take them together every 6 hours - it’s your choice    5. Only if you are still  having pain that restricts you from sleeping or getting out of bed,  take 1 tablet of oxycodone 5mg (or other prescribed opioid) every 4-6 hours as  needed for discomfort that remains after taking Tylenol and Advil.    Many people do not need opioids to manage their post-surgical pain, so you might  choose not to fill the prescription or fill only part of your prescription. If you use an  opioid (narcotic), you must beware of becoming drowsy or inattentive, and you will not  be able to drive or operate heavy machinery. Additional side effects include dizziness,  lightheadedness, constipation, nausea, and vomiting        Wound Care After Surgery  Your incision sites will be covered in several layers with two dressings.    On the outside (visible) will be a clear plastic dressing with a gauze underneath. This dressing and gauze should be removed 48 hrs after surgery (postoperative day 2). Just simply peel this dressing off of your skin. It is important to remove this dressing 48 hrs after surgery to avoid bacterial overgrowth under the dressing.    Once you remove the outer dressing, you will see several small steri-strips (butterfly dressings). These are 1/2" in width and 1-2" long. These can be gently peeled off your skin 7 days after surgery. It is important to remove these after 7 days. If they fall off prior to 7 days, that's okay; just leave the incision open to air.    You will have sutures in your skin that you cannot see (below the skin). These sutures will dissolve on their own; you will not need any sutures removed.    If you have a larger incision that is closed with surgical staples, these will need to be removed approximately 14 days after your surgery date. Laparoscopic surgery and smaller incisions are usually closed with suture only; longer incisions will be closed with staples.      Additional Information  Here are some additional websites that can provide you some additional information regarding  hernia repair:    [email or web address]/publications/[email or web address]-information/[email or web address]-surgery-patient-information-from-sages/    https://www.sages.org/publications/patient-information/patient-information-[email or web address]-[email or web address]-ventral-[email or web address]-repair-from-sages/      Thank you again for choosing the [facility] for your care.     We are looking forward to seeing you in the near future for your surgery.    If you have   any questions or concerns, please contact our clinic anytime, or you can contact me via e-care on your patient portal through the [facility] system (instructions on sign up for e-care are included in this paperwork).      If you would like to provide [place] additional feedback on your healthcare experience with Dr. Lorin Mercy and the [facility], you can do so at the following websites:    HealthGrades: [email or web address]    Vitals: [email or web address]      Herbie Baltimore B. Lorin Mercy, MD  Clinical Associate Professor  [facility] of [facility]  [address], [place]  [place] [address]    Phone: [phone]  Fax: [phone]

## 2021-07-26 NOTE — Progress Notes (Signed)
Patient intends to undergo  RA lap bilateral inguinal hernia repair on date TBD    Present for teaching: Patient   An interpreter was not needed for the visit.    TOPICS TAUGHT:   (X) Medications to Avoid Before Surgery  (X) Pain Management  (X) Constipation After Your Operation  (X) Preventing Blood Clots  (X) About your Surgery Experience   (X) Information About Your Health Care  (X) Recovering at Home Following Anesthesia.  (X) [place] Location and Floor Map    Patient was instructed not to shave any part of the body for 48 hours prior to surgery. Instructed not to use make-up, deodorant, lotions, hair products, or fragrances on the day of surgery.  Patient was instructed to use Antibacterial Soap to shower from the neck down both the night before surgery and again the morning of surgery prior to coming to the hospital.  Surgical Soap provided.  Patient was instructed to be NPO after midnight the night before surgery.  Patient was instructed to leave valuables at home.    Patient was instructed to go to nearest ER if experiencing life-threatening signs and symptoms. Patient instructed to have a responsible person as an escort home after discharge from hospital after surgery, also to have a responsible adult present with him for the first 24 hours at home. Patient's escort will be TBD    Patient was provided phone numbers to call for questions or concerns and understands reasons to phone.    TEACHING METHOD(S) USED:  (X) One-on one teaching  (X) Written materials    RESPONSE TO TEACHING/OUTCOMES:  (X) Voiced understanding  (X) Repeated back  Further instruction/reinforcement needed: NO      Multiple questions asked and answered  Patient agrees and confirms this surgical plan.

## 2021-07-26 NOTE — Progress Notes (Signed)
Eric Walls, a 25 year old male, is referred by Dr. Coralee Rud for consultation regarding management of Hernia.    REVIEW OF SYSTEMS:  ANY CURRENT PROBLEMS WITH YOUR HEALTH? ADDITIONAL INFORMATION   GENERAL Recent Weight Gain/Loss  Fatigue/Trouble Sleeping  Fever/Chills/Night Sweats  Anesthesia Problems (self)  Anesthesia Problems (family) No  No  No  No  No Current Ht & Wt:  Ht 6\' 3"  (1.905 m)  Wt 160 lb (72.576 kg)  Body mass index is 20 kg/m.   EAR/  NOSE/  MOUTH/ THROAT Hearing Loss/Hearing Aid  Ear Problems  Nose Problems  Mouth or Throat Problems  Nose bleeds/Sinus Problems  Dental Problems/Dentures  Loose or Missing Tooth/Teeth No  No  No  No  No  No  No    EYES Wear glasses/contacts  Eye Problems  Yellowing of white part of eyes No  No  No    NEURO Problems with vision  Headaches/Dizziness  Seizures  Fainting/Unconsciousness  Numbness/Tingling/Weakness No  No  No  No  No    HEART Chest Pain  Heart Murmur  High Blood Pressure  Recent Heart Attack/MI  Artificial Heart Valve(s)  Able to walk two flights of stairs No  No  No  No  No  Yes    LUNGS Shortness of breath (day or night)  Asthma  Sleep Apnea/Snoring  Difficulty sleeping  Lung problems  Recent cold or cough No  No  No  No  No  No    SKIN Masses/Bumps/Lumps  Rashes  Lesions/Cuts/Scrapes  Wounds/Blisters No  No  No  No    STOMACH/  GI/    COLON/ RECTUM Stomach/Abdominal Pain  Hiatal hernia  Heartburn/Indigestion  Nausea/Vomiting  Diarrhea  Constipation  Blood in Stool  Jaundice/Yellowing of skin  Hepatitis  No  No  No  No  No  No  No  No  No                MUSCLES/ BONES        Joint Pain  Back Pain/Disc Disease  Sprain/Strain  Stiffness/Arthritis  Artificial joint(s)  Other physical disability No  No  No  No  No  No              URINARY TRACT      Reproductive Urinary Problems  Pain with urination  Kidney Problems/Kidney Stones  Male or Male Specific Problems  Females-Could you be pregnant? No  No  No  No  NO    BLOOD/ LYMPH Bleeding  Problems  Anemia  Swollen or enlarged glands No  No  No    IMMUNO Hay Fever  Allergies  HIV/Aids No  No  No    ENDO Heat/Cold Intolerance  Hyperthyroid/Hypothyroid  Increased thirst/Diabetes No  No  No    MENTAL HEALTH Anxiety/Depression  Psychiatric Care  Other Concerns No  No  No      Is there anything else you feel you should tell ?

## 2021-07-26 NOTE — Progress Notes (Signed)
[place] [facility]    History and Physical Exam    Chief Complaint: Inguinal hernia    Referring Physician: Frances Maywood, MD    Eric Walls is a 25 year old man who presents with inguinal hernia.    He experienced some discomfort in the right testicle.    He presented to Dr. Coralee Rud in urgent care and was identified that he has bilateral inguinal hernias and recommended evaluation prior to a motorcycle trip to [place].    Felt some discomfort in the right groin that had been going on for several weeks. He is an active guy who hikes and skis but does not remember a specific event that preceded these events. Backpacking trip end of August as well.    No bowel/bladder issues.    Increasing fiber in diet but no other GI symptoms.    No symptoms of incarceration or strangulation.    Previous Abdominal Operations:  None     Previous Imaging Studies Related to the Chief Complaint:  None    REVIEW OF SYSTEMS:  The patient was provided a complete review of systems, which was completed in its entirety. This complete review of systems was reviewed by myself today in clinic with the patient. The patient self-reported form is scanned into the patient's electronic medical record. The positives and pertinent negatives are also noted in the history of present illness, above.    I personally reviewed and confirmed the ROS, past medical history, past surgical history, family history and social history in the record with the patient today.    ALLERGIES:  Patient has no known allergies.    CURRENT MEDICATIONS:  Current Outpatient Medications   Medication Sig Dispense Refill    doxycycline monohydrate 100 MG capsule Take 1 capsule (100 mg) by mouth 2 times a day for 7 days. Take until gone. 14 capsule 0     No current facility-administered medications for this visit.       Past Medical History:   Diagnosis Date    Ventral hernia      @PROBLEMLIST @    History reviewed. No pertinent surgical  history.        SOCIAL HISTORY:  Social History     Socioeconomic History    Marital status: Single     Spouse name: Not on file    Number of children: Not on file    Years of education: Not on file    Highest education level: Not on file   Occupational History    Not on file   Tobacco Use    Smoking status: Never Smoker    Smokeless tobacco: Never Used   Substance and Sexual Activity    Alcohol use: Yes     Alcohol/week: 3.0 standard drinks     Types: 3 Cans of beer per week    Drug use: Yes     Types: Marijuana    Sexual activity: Not on file   Other Topics Concern    Not on file   Social History Narrative    Not on file     Social Determinants of Health     Financial Resource Strain: Not on file   Food Insecurity: Not on file   Transportation Needs: Not on file   Physical Activity: Not on file   Stress: Not on file   Social Connections: Not on file   Intimate Partner Violence: Not on file   Housing Stability: Not on file  EXAMINATION:  Vitals:    07/26/21 0842   BP: (!) 81/34   BP Cuff Size: Regular   BP Site: Right Arm   BP Position: Sitting   Pulse: 79   Temp: 36.6 C   TempSrc: Temporal   SpO2: 100%   Weight: 72.6 kg (160 lb)   Height: 6\' 3"  (1.905 m)     Body mass index is 20 kg/m.    PHYSICAL EXAMINATION  Awake, alert, comfortable  Right groin: Small inguinal hernia; reducible  Left groin: Very small inguinal hernia  No edema      IMPRESSION:  Eric Walls is a 25 year old man with bilateral inguinal hernia, right symptomatic.    The pathophysiology, natural history, and treatment options for inguinal hernia were discussed with the patient, who voiced his understanding.     Specifically, we discussed RA lap bilateral inguinal hernia repair. The potential complications specific to this operation were discussed, including recurrence, infection, bleeding, testicular injury, nerve injury, chronic groin pain, bleeding, conversion to open procedure and risks of anesthesia.    I explained to  the patient the signs and symptoms of acute hernia incarceration.  Specifically, I explained that these symptoms can include a sudden worsening of pain at the hernia site, a bulge at the hernia site that cannot be reduced, or any symptoms of a bowel obstruction including nausea, vomiting, or lack of bowel function.  I explained that if any of these symptoms develop, the patient should be seen immediately in person by a physician or other healthcare provider who can assess the patient for possible incarcerated hernia and recommend appropriate treatment.  I explained that an acutely incarcerated hernia can be a life-threatening condition and warrants immediate evaluation.        PLAN:  After discussion of the diagnosis, recommended treatment, and alternate treatments the patient decided to proceed with the above operative plan.     We will schedule the patient for the above operation at a mutually convenient time.        Eric Tessmer B. 30, MD  Clinical Associate Professor  [facility]  [address], [address] [place]  [place]    Phone: [phone]  Fax: [phone]

## 2021-08-08 ENCOUNTER — Telehealth (INDEPENDENT_AMBULATORY_CARE_PROVIDER_SITE_OTHER): Payer: Self-pay | Admitting: Surgery

## 2021-08-08 NOTE — Telephone Encounter (Signed)
Pa not required [phone] per [facility]

## 2021-08-30 ENCOUNTER — Ambulatory Visit: Payer: 59

## 2021-08-30 ENCOUNTER — Encounter (HOSPITAL_COMMUNITY): Payer: Self-pay

## 2021-08-30 ENCOUNTER — Other Ambulatory Visit: Payer: Self-pay

## 2021-08-30 ENCOUNTER — Encounter (INDEPENDENT_AMBULATORY_CARE_PROVIDER_SITE_OTHER): Payer: Self-pay | Admitting: Surgery

## 2021-08-30 NOTE — Preprocedure Instructions (Signed)
Pre-Surgery Instructions:   Medication Instructions    ibuprofen 200 MG tablet Per Physician instructions    loratadine 10 MG tablet Take day of surgery    VITAMIN D OR Hold 1 week prior to surgery   Instructions for Upcoming Surgery     Please arrive on Wednesday 09-05-21 at 6:00 am for a surgery time of 7:30 am at [facility] entrance [phone], parking lot F).   Procedure length is 90" plus approximately 2 hours in recovery following the surgery.   Take medication as directed in the morning with a sip of water at least 2 hours prior to your surgery time.   Shower the evening before and the morning of surgery (unless otherwise instructed by physician).  Use antibacterial soap if provided by the surgeon. After your shower please do not put any products on the skin or hair including; lotions, powders, deodorant, antiperspirant, aftershave, perfume, face cream, makeup, conditioners, gel, mouse or hairspray.  Do not shave for 48 hours prior to surgery.     The morning of your surgery, you may brush your teeth with toothpaste and rinse with water, No chewing gum, cough drops, mints or candy     Wear loose comfortable clothing. You may wish to wear a button style shirt if your surgery is above the waist.     Please leave all jewelry at home, including wedding rings, watches, necklaces and all piercings.  Plastic spacers are okay-except in mouth or in surgical area.       Please bring eyeglass case if you have one. You may not wear contact lenses during surgery.     If spending night, you may bring a few toiletries (like toothbrush, brush, phone charger).      Bring your insurance card and photo identification day of surgery.    **If you use a CPAP or BIPAP bring with you the day of surgery.     Parking is $10 per day.  In/out of campus under 30 minutes is free.      For questions to Pre-Anesthesia, please call [phone].  For issues/concerns on day of surgery, please call  Pre-Surgical Admit [phone].       Pre Anesthesia call duration with patient 20"

## 2021-08-30 NOTE — Anesthesia Preprocedure Evaluation (Addendum)
Patient: Eric Walls    Procedure Information     Date/Time: 09/05/21 0720    Procedure: XI ROBOT ASSISTED INGUINAL HERNIA REPAIR WITH MESH (Bilateral: Abdomen)    Location: [place] [facility] MA[facility] OR 12 / [place] [facility] MA[facility] OR    Surgeons: Eric Pho, MD        HPI:   Eric Pho, MD (Physician)   General Surgery   Encounter Date: 07/26/2021     History and Physical Exam    Chief Complaint: Inguinal hernia    Referring Physician: Frances Maywood, MD    Eric Walls is a 25 year old man who presents with inguinal hernia.    He experienced some discomfort in the right testicle.    He presented to Dr. Coralee Walls in urgent care and was identified that he has bilateral inguinal hernias and recommended evaluation prior to a motorcycle trip to [place].    Relevant Problems   No relevant active problems     Relevant surgical history:   Past Surgical History:   Procedure Laterality Date    PR ORAL SURGERY PROCEDURE           Medications:     Outpatient:   No current outpatient medications             Review of patient's allergies indicates:  No Known Allergies    Social History:   Social History     Tobacco Use    Smoking status: Never    Smokeless tobacco: Never   Substance Use Topics    Alcohol use: Yes     Alcohol/week: 3.0 standard drinks     Types: 3 Cans of beer per week    Drug use: Yes     Frequency: 7.0 times per week     Types: Marijuana       Medical History and Review of Systems  Documentation reviewed: patient health questionnaire and electronic medical record.    Source of information: Chart review, Phone evaluation and In person visit.  History of anesthetic complications  (-) History of anesthetic complications.  (-) family history of anesthetic complications.      Functional Status   Exercise regularly, able to climb 2 flights of stairs or more without stopping, able to climb 1 flight of stairs without stopping, able to walk 1 city block (200 yards) and able to lay flat and still for 30 minutes.    Functional status comments: Works out with [employer] does cardio as well    Pulmonary   Neg pulmonary ROS    Neuro/Psych   Neg neuro/psych ROS    Cardiovascular   Neg cardio ROS    HEENT   Neg HEENT ROS    Musculoskeletal   Neg musculoskeletal ROS    Skin   negative skin ROS    GI/Hepatic/Renal Inguinal hernia   neg GI/hepatic/renal ROS    Endo/Immunology   neg endo/other ROS    Hematology   negative hematology ROS  Oncology   negative hematology/oncology ROS            Physical Exam  Airway  Mallampati:  II  TM distance:  >6 cm  Neck ROM:  Full  Mouth Opening:  Normal  Facial Hair:  None    Dental  normal      Cardiovascular  normal     no murmur    Pulmonary  normal    Breath sounds clear to auscultation  Labs: (last year)   No labs identified within the last year      Relevant procedures / diagnostic studies: none    PAT CLINIC DISCUSSION    ANESTHESIA PLAN   Informed Consent:     Anesthesia Plan discussed with:        Patient    ASA Score:     ASA: 1  Planned Anesthetic Type:      general      Risk Calculators / Scores:     PONV: Moderate Risk  Total Score: 2            Non-smoker    Intended opioid administration        Criteria that do not apply:    Male patient    History of PONV    History of motion sickness

## 2021-09-02 ENCOUNTER — Other Ambulatory Visit (INDEPENDENT_AMBULATORY_CARE_PROVIDER_SITE_OTHER): Payer: Self-pay | Admitting: Surgery

## 2021-09-02 DIAGNOSIS — K4020 Bilateral inguinal hernia, without obstruction or gangrene, not specified as recurrent: Secondary | ICD-10-CM

## 2021-09-02 MED ORDER — IBUPROFEN 600 MG OR TABS
600.0000 mg | ORAL_TABLET | Freq: Four times a day (QID) | ORAL | 0 refills | Status: DC
Start: 2021-09-02 — End: 2021-09-05

## 2021-09-02 MED ORDER — POLYETHYLENE GLYCOL 3350 17 GM/SCOOP OR POWD
17.0000 g | Freq: Two times a day (BID) | ORAL | 1 refills | Status: DC
Start: 2021-09-02 — End: 2021-09-05

## 2021-09-02 MED ORDER — ONDANSETRON 4 MG OR TBDP
8.0000 mg | ORAL_TABLET | Freq: Three times a day (TID) | ORAL | 0 refills | Status: DC | PRN
Start: 2021-09-02 — End: 2021-09-05

## 2021-09-02 MED ORDER — ACETAMINOPHEN 500 MG OR TABS
500.0000 mg | ORAL_TABLET | Freq: Four times a day (QID) | ORAL | 0 refills | Status: DC
Start: 2021-09-02 — End: 2021-09-05

## 2021-09-02 MED ORDER — OXYCODONE HCL 5 MG OR TABS
5.0000 mg | ORAL_TABLET | ORAL | 0 refills | Status: DC | PRN
Start: 2021-09-02 — End: 2021-09-05

## 2021-09-05 ENCOUNTER — Ambulatory Visit (HOSPITAL_COMMUNITY): Payer: Self-pay | Admitting: Surgery

## 2021-09-05 ENCOUNTER — Ambulatory Visit
Admission: RE | Admit: 2021-09-05 | Discharge: 2021-09-05 | Disposition: A | Discharge status: Home / Self Care | Payer: 59 | Attending: Surgery | Admitting: Surgery

## 2021-09-05 ENCOUNTER — Encounter (HOSPITAL_COMMUNITY)
Admission: RE | Disposition: A | Discharge status: Home / Self Care | Payer: Self-pay | Source: Home / Self Care | Attending: Surgery

## 2021-09-05 ENCOUNTER — Encounter (HOSPITAL_COMMUNITY): Payer: Self-pay

## 2021-09-05 ENCOUNTER — Ambulatory Visit (HOSPITAL_BASED_OUTPATIENT_CLINIC_OR_DEPARTMENT_OTHER): Payer: 59 | Admitting: Registered Nurse

## 2021-09-05 ENCOUNTER — Encounter (HOSPITAL_COMMUNITY): Payer: Self-pay | Admitting: Surgery

## 2021-09-05 DIAGNOSIS — K4190 Unilateral femoral hernia, without obstruction or gangrene, not specified as recurrent: Secondary | ICD-10-CM | POA: Insufficient documentation

## 2021-09-05 DIAGNOSIS — K4020 Bilateral inguinal hernia, without obstruction or gangrene, not specified as recurrent: Secondary | ICD-10-CM | POA: Insufficient documentation

## 2021-09-05 LAB — GLUCOSE, FINGERSTICK POC: Glucose, Finger Stick POC: 102 mg/dL (ref 62–125)

## 2021-09-05 SURGERY — REPAIR, HERNIA, INGUINAL, ROBOT-ASSISTED, XI
Anesthesia: General | Site: Abdomen | Laterality: Bilateral | Wound class: Class I/ Clean

## 2021-09-05 MED ORDER — SODIUM CHLORIDE FLUSH 0.9 % IV SOLN
INTRAVENOUS | Status: DC | PRN
Start: 2021-09-05 — End: 2021-09-05
  Administered 2021-09-05: 10 mL via INTRAVENOUS

## 2021-09-05 MED ORDER — MEPERIDINE HCL 25 MG/ML IJ SOLN
INTRAMUSCULAR | Status: AC
Start: 2021-09-05 — End: 2021-09-05
  Administered 2021-09-05: 10 mg via INTRAVENOUS
  Filled 2021-09-05: qty 1

## 2021-09-05 MED ORDER — ACETAMINOPHEN 500 MG OR TABS
1000.0000 mg | ORAL_TABLET | Freq: Four times a day (QID) | ORAL | 0 refills | Status: DC | PRN
Start: 2021-09-05 — End: 2023-09-02

## 2021-09-05 MED ORDER — PROPOFOL 10 MG/ML IV EMUL WRAPPER (OSM ONLY)
INTRAVENOUS | Status: DC | PRN
Start: 2021-09-05 — End: 2021-09-05
  Administered 2021-09-05: 200 mg via INTRAVENOUS

## 2021-09-05 MED ORDER — ONDANSETRON HCL 4 MG/2ML IJ SOLN
INTRAMUSCULAR | Status: DC | PRN
Start: 2021-09-05 — End: 2021-09-05
  Administered 2021-09-05: 4 mg via INTRAVENOUS

## 2021-09-05 MED ORDER — LIDOCAINE 4 % EX CREA
TOPICAL_CREAM | CUTANEOUS | Status: DC | PRN
Start: 2021-09-05 — End: 2021-09-05

## 2021-09-05 MED ORDER — ONDANSETRON HCL 4 MG/2ML IJ SOLN
INTRAMUSCULAR | Status: AC
Start: 2021-09-05 — End: ?
  Filled 2021-09-05: qty 2

## 2021-09-05 MED ORDER — DEXAMETHASONE SODIUM PHOSPHATE 4 MG/ML IJ SOLN
INTRAMUSCULAR | Status: AC
Start: 2021-09-05 — End: ?
  Filled 2021-09-05: qty 2

## 2021-09-05 MED ORDER — PROPOFOL 200 MG/20ML IV EMUL
INTRAVENOUS | Status: AC
Start: 2021-09-05 — End: ?
  Filled 2021-09-05: qty 20

## 2021-09-05 MED ORDER — ACETAMINOPHEN 500 MG OR TABS
1000.0000 mg | ORAL_TABLET | ORAL | Status: AC
Start: 2021-09-05 — End: 2021-09-05
  Administered 2021-09-05: 1000 mg via ORAL
  Filled 2021-09-05: qty 2

## 2021-09-05 MED ORDER — LACTATED RINGERS BOLUS
500.0000 mL | Freq: Once | INTRAVENOUS | Status: DC | PRN
Start: 2021-09-05 — End: 2021-09-05

## 2021-09-05 MED ORDER — SUGAMMADEX SODIUM 200 MG/2ML IV SOLN
INTRAVENOUS | Status: AC
Start: 2021-09-05 — End: ?
  Filled 2021-09-05: qty 2

## 2021-09-05 MED ORDER — MEPERIDINE HCL 25 MG/ML IJ SOLN
10.0000 mg | INTRAMUSCULAR | Status: DC | PRN
Start: 2021-09-05 — End: 2021-09-05
  Administered 2021-09-05: 15 mg via INTRAVENOUS

## 2021-09-05 MED ORDER — ONDANSETRON HCL 4 MG/2ML IJ SOLN
4.0000 mg | INTRAMUSCULAR | Status: DC | PRN
Start: 2021-09-05 — End: 2021-09-05

## 2021-09-05 MED ORDER — CEFAZOLIN SODIUM-DEXTROSE 2-4 GM/100ML-% IV SOLN
2.0000 g | INTRAVENOUS | Status: AC
Start: 2021-09-05 — End: 2021-09-05
  Administered 2021-09-05: 2 g via INTRAVENOUS
  Filled 2021-09-05: qty 100

## 2021-09-05 MED ORDER — LACTATED RINGERS IV SOLN
10.0000 mL/h | INTRAVENOUS | Status: DC
Start: 2021-09-05 — End: 2021-09-05

## 2021-09-05 MED ORDER — LIDOCAINE HCL PF 2% IV/IJ SOSY/SOLN WRAPPER (ANESTHESIA OSM ONLY)
INTRAVENOUS | Status: DC | PRN
Start: 2021-09-05 — End: 2021-09-05
  Administered 2021-09-05: 50 mg via INTRAVENOUS

## 2021-09-05 MED ORDER — PROPOFOL 10 MG/ML IV EMUL WRAPPER (OSM ONLY)
INTRAVENOUS | Status: DC | PRN
Start: 2021-09-05 — End: 2021-09-05
  Administered 2021-09-05: 125 ug/kg/min via INTRAVENOUS

## 2021-09-05 MED ORDER — FENTANYL CITRATE (PF) 100 MCG/2ML IJ SOLN
INTRAMUSCULAR | Status: AC
Start: 2021-09-05 — End: ?
  Filled 2021-09-05: qty 2

## 2021-09-05 MED ORDER — ATROPINE SULFATE 1 MG/10ML IJ SOSY
0.5000 mg | PREFILLED_SYRINGE | INTRAMUSCULAR | Status: DC | PRN
Start: 2021-09-05 — End: 2021-09-05

## 2021-09-05 MED ORDER — ALBUTEROL SULFATE (2.5 MG/3ML) 0.083% IN NEBU
2.5000 mg | INHALATION_SOLUTION | Freq: Once | RESPIRATORY_TRACT | Status: DC | PRN
Start: 2021-09-05 — End: 2021-09-05

## 2021-09-05 MED ORDER — OXYCODONE HCL 5 MG OR TABS
5.0000 mg | ORAL_TABLET | Freq: Once | ORAL | Status: AC | PRN
Start: 2021-09-05 — End: 2021-09-05
  Administered 2021-09-05: 5 mg via ORAL
  Filled 2021-09-05: qty 1

## 2021-09-05 MED ORDER — PROPOFOL 1000 MG/100ML IV EMUL
INTRAVENOUS | Status: AC
Start: 2021-09-05 — End: ?
  Filled 2021-09-05: qty 100

## 2021-09-05 MED ORDER — ROCURONIUM BROMIDE 50 MG/5ML IV SOLN
INTRAVENOUS | Status: AC
Start: 2021-09-05 — End: ?
  Filled 2021-09-05: qty 10

## 2021-09-05 MED ORDER — BUPIVACAINE HCL (PF) 0.25 % IJ SOLN
INTRAMUSCULAR | Status: AC
Start: 2021-09-05 — End: ?
  Filled 2021-09-05: qty 30

## 2021-09-05 MED ORDER — ROCURONIUM BROMIDE 50 MG/5ML IV SOLN
INTRAVENOUS | Status: DC | PRN
Start: 2021-09-05 — End: 2021-09-05
  Administered 2021-09-05: 10 mg via INTRAVENOUS
  Administered 2021-09-05: 20 mg via INTRAVENOUS
  Administered 2021-09-05: 10 mg via INTRAVENOUS
  Administered 2021-09-05: 40 mg via INTRAVENOUS

## 2021-09-05 MED ORDER — NALOXONE HCL 0.4 MG/ML IJ SOLN
0.0400 mg | INTRAMUSCULAR | Status: DC | PRN
Start: 2021-09-05 — End: 2021-09-05

## 2021-09-05 MED ORDER — SODIUM CHLORIDE 0.9 % IR SOLN
Status: DC | PRN
Start: 2021-09-05 — End: 2021-09-05
  Administered 2021-09-05: 1000 mL

## 2021-09-05 MED ORDER — LACTATED RINGERS IV SOLN
100.0000 mL/h | INTRAVENOUS | Status: DC
Start: 2021-09-05 — End: 2021-09-05

## 2021-09-05 MED ORDER — KETOROLAC TROMETHAMINE 30 MG/ML IJ SOLN WRAPPER (ANESTHESIA OSM ONLY)
INTRAMUSCULAR | Status: DC | PRN
Start: 2021-09-05 — End: 2021-09-05
  Administered 2021-09-05: 30 mg via INTRAVENOUS

## 2021-09-05 MED ORDER — METOCLOPRAMIDE HCL 5 MG/ML IJ SOLN
5.0000 mg | Freq: Once | INTRAMUSCULAR | Status: DC | PRN
Start: 2021-09-05 — End: 2021-09-05

## 2021-09-05 MED ORDER — DEXAMETHASONE SODIUM PHOSPHATE 4 MG/ML IJ SOLN
INTRAMUSCULAR | Status: DC | PRN
Start: 2021-09-05 — End: 2021-09-05
  Administered 2021-09-05: 8 mg via INTRAVENOUS

## 2021-09-05 MED ORDER — LIDOCAINE HCL (PF) 1 % IJ SOLN
INTRAMUSCULAR | Status: AC
Start: 2021-09-05 — End: ?
  Filled 2021-09-05: qty 5

## 2021-09-05 MED ORDER — MIDAZOLAM HCL (PF) 2 MG/2ML IJ SOLN
INTRAMUSCULAR | Status: AC
Start: 2021-09-05 — End: ?
  Filled 2021-09-05: qty 2

## 2021-09-05 MED ORDER — IBUPROFEN 600 MG OR TABS
600.0000 mg | ORAL_TABLET | Freq: Four times a day (QID) | ORAL | 0 refills | Status: DC | PRN
Start: 2021-09-05 — End: 2021-12-10

## 2021-09-05 MED ORDER — SUGAMMADEX SODIUM 200 MG/2ML IV SOLN
INTRAVENOUS | Status: DC | PRN
Start: 2021-09-05 — End: 2021-09-05
  Administered 2021-09-05: 100 mg via INTRAVENOUS

## 2021-09-05 MED ORDER — MIDAZOLAM HCL (PF) 1 MG/ML IJ SOLN WRAPPER (ANESTHESIA OSM ONLY)
INTRAMUSCULAR | Status: DC | PRN
Start: 2021-09-05 — End: 2021-09-05
  Administered 2021-09-05: 2 mg via INTRAVENOUS

## 2021-09-05 MED ORDER — OXYCODONE HCL 5 MG OR TABS
5.0000 mg | ORAL_TABLET | Freq: Four times a day (QID) | ORAL | 0 refills | Status: DC | PRN
Start: 2021-09-05 — End: 2021-12-10

## 2021-09-05 MED ORDER — FENTANYL CITRATE (PF) 50 MCG/ML IJ SOLN WRAPPER (ANESTHESIA OSM ONLY)
INTRAMUSCULAR | Status: DC | PRN
Start: 2021-09-05 — End: 2021-09-05
  Administered 2021-09-05 (×2): 50 ug via INTRAVENOUS
  Administered 2021-09-05: 100 ug via INTRAVENOUS

## 2021-09-05 MED ORDER — POLYETHYLENE GLYCOL 3350 17 GM/SCOOP OR POWD
17.0000 g | Freq: Every day | ORAL | 0 refills | Status: AC
Start: 2021-09-05 — End: 2021-10-05

## 2021-09-05 MED ORDER — BUPIVACAINE HCL (PF) 0.25 % IJ SOLN
INTRAMUSCULAR | Status: DC | PRN
Start: 2021-09-05 — End: 2021-09-05
  Administered 2021-09-05: 30 mL via INTRAMUSCULAR

## 2021-09-05 MED ORDER — KETOROLAC TROMETHAMINE 60 MG/2ML IM SOLN
INTRAMUSCULAR | Status: AC
Start: 2021-09-05 — End: ?
  Filled 2021-09-05: qty 2

## 2021-09-05 MED ORDER — FENTANYL CITRATE (PF) 100 MCG/2ML IJ SOLN
25.0000 ug | INTRAMUSCULAR | Status: DC | PRN
Start: 2021-09-05 — End: 2021-09-05

## 2021-09-05 MED ORDER — LACTATED RINGERS IV SOLN
10.0000 mL/h | INTRAVENOUS | Status: DC
Start: 2021-09-05 — End: 2021-09-05
  Administered 2021-09-05: 10 mL/h via INTRAVENOUS

## 2021-09-05 MED ORDER — HYDROMORPHONE HCL 1 MG/ML IJ SOLN
0.2000 mg | INTRAMUSCULAR | Status: DC | PRN
Start: 2021-09-05 — End: 2021-09-05

## 2021-09-05 SURGICAL SUPPLY — 33 items
APPLICATOR PREP CHLORAPREP 26ML HI-LITE ORANGE 2% CHG (Prep) ×2 IMPLANT
COVER ENDOSCOPE TIP HOT SHEARS DA VINCI ENDOWRIST (Cautery) ×2 IMPLANT
COVER EQUIP PROXIMA 53INX24IN MAYOSTAND (Drape) ×2 IMPLANT
DEVICE CLOSURE V-LOC 180 GS-21 SZ 0 9IN GREEN (Suture)
DEVICE CLOSURE V-LOC 180 GS-21 SZ2-0 12IN GREEN (Suture) ×6
DEVICE CLOSURE V-LOC 180 GS-21 SZ2-0 9IN GREEN (Suture)
DRAPE EQUIP DA VINCI COLUMN (Drape) ×2 IMPLANT
DRAPE EQUIP DA VINCI XI 21INX19INX10.5IN ARM (Drape) ×6 IMPLANT
DRAPE PROXIMA 121INX102INX78IN ABSORB REINFORCEMENT FENESTRATE (Drape) ×2 IMPLANT
DRESSING 2 3/4INX2IN TRANSPARENT (Dressing) ×6 IMPLANT
DRESSING TRANSPARENT 2-3/4INX2-3/8IN TEGADERM HP (Dressing) ×2 IMPLANT
ELECTRODE SS PENCIL 3/32IN CAUTERY ROCKER SWITCH STD SHAFT (Cautery) ×2 IMPLANT
LINEN GOWN XL (Gown) ×4 IMPLANT
LINEN PACK SURGICAL (Other) ×2 IMPLANT
LUBRICANT ELECTRO LUBE 4ML (Other) ×2 IMPLANT
MESH PROGRIP 16CMX12CM (Mesh) ×4 IMPLANT
NEEDLE HYPODERMIC 21GA 1 1/2IN BD (Needle) ×2 IMPLANT
PACK ROBOTIC DA VINCI (Pack) ×2 IMPLANT
SCISSOR METZENBAUM CURVE .75IN CUTTING EDGE (Laparoscopic) IMPLANT
SEAL ENDO INSTR 5-8MM DA VINCI XI UNIV SINGLE USE (Other) ×6 IMPLANT
SKIN CLOSURE STERI-STRIP 3INX1/4IN (Dressing) IMPLANT
SKIN CLOSURE STERI-STRIP 4INX1/2IN (Dressing) ×2 IMPLANT
SLEEVE PNEUMATIC KNEE MEDIUM (Other) ×2 IMPLANT
SUPP SCROTAL 2.75IN 26-32IN SM REG POLYESTER (Other) IMPLANT
SUPP SCROTAL 2.75IN 32-38IN MED REG POLYESTER (Other) IMPLANT
SUPP SCROTAL 2.75IN 38-44IN LG REG POLYESTER (Other) IMPLANT
SUPP SCROTAL 2.75IN 44-50IN XL REG POLYESTER (Other) IMPLANT
SUTURE BIOSYN 4-0 P-12 30IN UNDYED (Suture) ×2 IMPLANT
SUTURE DEVICE CLOSURE V-LOC 180 GS-21 SZ 0 9IN GREEN (Suture) IMPLANT
SUTURE DEVICE CLOSURE V-LOC 180 GS-21 SZ2-0 12IN GREEN (Suture) ×3 IMPLANT
SUTURE DEVICE CLOSURE V-LOC 180 GS-21 SZ2-0 9IN GREEN (Suture) IMPLANT
SUTURE POLYSORB 3-0 V-20 30IN UNDYED (Suture) IMPLANT
SYS IMAGING 8X6IN CLEARIFY MICROFIBER WARM HUB (Other) ×2 IMPLANT

## 2021-09-05 NOTE — Anesthesia Procedure Notes (Signed)
Airway Placement    Staff:  Performing Provider: Sanjuana Letters, MD  Authorizing Provider: Sanjuana Letters, MD    Airway management:   Patient location: OR/Procedural area  Final airway type: Endotracheal airway  Intubation reason: General anesthesia    Induction:  Positioning: supine  Patient was pre-oxygenated: yes  Mask Ventilation: Grade 1 - Ventilated by mask     Intubation:    Final Attempt   Airway Type: ETT  Primary Laryngoscopy: Colin Ina Size: 2  Laryngoscopic View: Grade I  ETT Type: standard, cuffed  ETT Route: oral  Size: 7.5  ETT secured with adhesive tape  Depth at: teeth  (23cm)    Number of Attempts: 1    Assessment:  Confirmation: waveform capnography, direct visualization and auscultation  Procedure Abandoned: no    Date / Time Airway Secured / Re-Secured:  09/05/2021 7:54 AM

## 2021-09-05 NOTE — Procedure Nursing Note (Signed)
PT VOIDED PRIOR TO DISCHARGE

## 2021-09-05 NOTE — Anesthesia Postprocedure Evaluation (Signed)
Patient: Jordie Schreur    Procedure Summary     Date: 09/05/21 Room / Location: [place] [facility] OR 12 / [place] [facility] MA[facility] OR    Anesthesia Start: 0740 Anesthesia Stop: 0932    Procedure: XI ROBOT ASSISTED INGUINAL HERNIA REPAIR WITH MESH (Bilateral: Abdomen) Diagnosis:       Bilateral inguinal hernia without obstruction or gangrene, recurrence not specified      (Bilateral inguinal hernia without obstruction or gangrene, recurrence not specified [K40.20])    Surgeons: Fayette Pho, MD Responsible Provider: Sanjuana Letters, MD    Anesthesia Type: general ASA Status: 1        Final Anesthesia Type: general    Vitals Value Taken Time   BP 138/81 09/05/21 0930   Temp  09/05/21 0933   Pulse 90 09/05/21 0933   SpO2 100 % 09/05/21 0933   Vitals shown include unvalidated device data.    Place of evaluation: PACU    Patient participation: patient participated    Level of consciousness: sedated and responsive to voice    Patient pain control satisfaction: patient is satisfied with level of pain control    Airway patency: patent    Cardiovascular status during assessment: stable    Respiratory status during assessment: breathing comfortably    Anesthetic complications: no    Intravascular volume status assessment: euvolemic    Nausea / vomiting: patient is not experiencing nausea      Planned post-operative disposition at time of assessment: hospital discharge

## 2021-09-05 NOTE — Op Note (Signed)
Operative Report     Patient Name: Eric Walls, Eric Walls  Date of Service: September 05, 2021    Patient ID: [id number] Date of Birth: 07-26-1996    Clinician: Fayette Pho, MD Facility: [phone]   Location: [facility]         PREOPERATIVE DIAGNOSIS:    Bilateral inguinal hernia.     POSTOPERATIVE DIAGNOSIS:    Bilateral inguinal hernia.     OPERATION PERFORMED:     Robotic-assisted laparoscopic bilateral inguinal hernia repair with ProGrip mesh 12 x 16 cm.     SURGEON:    Levander Campion, MD      RESIDENT:  Idelle Crouch, MD      ANESTHESIA:    General endotracheal anesthesia.     ESTIMATED BLOOD LOSS:    5 mL.     INDICATIONS FOR OPERATION:    The patient is a 25 year old man who presented to my clinic with bilateral inguinal hernias.  We discussed options for management including watchful waiting versus operative repair with a robotic-assisted laparoscopic approach and after this discussion, he elected to proceed.     INTRAOPERATIVE FINDINGS:    1.  Bilateral direct inguinal hernias, small.  2.  On the left side, there was a moderate-sized femoral hernia with incarcerated fat.  3.  There was no evidence of an indirect inguinal hernia on the right or the left.       OPERATIVE REPORT IN DETAIL:   The patient was identified in the preoperative holding area and informed consent was confirmed.  The patient was then transported to the operating room.  Bilateral lower extremity sequential compression devices were placed.  General endotracheal anesthesia was induced without complication.  The patient's abdomen was clipped, prepped and draped in typical sterile fashion.  Preoperative antibiotics were administered.  After a final SCOAP procedure access to the peritoneal cavity was achieved with the robotic trocar just above the umbilicus using a Veress needle technique.  Two additional robotic trocars were placed.  Mesh and suture were placed in the peritoneal cavity.  The robot was docked and instruments were inserted under direct  visualization.  Peritoneal flap was created from the right mid abdomen, left mid abdomen across the midline down to the symphysis pubis, exposing the bilateral Cooper's ligaments.  Next, the left direct inguinal hernia space was identified and a small amount of preperitoneal fat was carefully dissected free.  The same process was repeated on the right hand side. On left side, there was evidence of a femoral hernia and this preperitoneal fat that was incarcerated in the femoral hernia space was completely dissected free from that location.  On the right there was no evidence of an indirect or femoral hernia.       The peritoneum was identified at the level of the anterior superior iliac spine on the left, it was dissected medially, posteriorly and cephalad away from the cord structures away from the deep inguinal ring and away from where the edge of the mesh would lie.  The same process was repeated on the right hand side.  The peritoneal edge was identified at the level of the anterior superior iliac spine.  It was dissected medially, posteriorly and cephalad away from the cord structures and away from where the edge of the mesh would lie.  Two pieces of ProGrip mesh, 12 x 16 cm were placed in the preperitoneal space, 1 in the right and 1 on the left.  They were unfolded to completely cover  the myopectineal regions on both sides.  The peritoneal flap was then closed with a 2-0 V-Loc suture.  The needle was removed.  All the instruments were removed and the robot was undocked.       Pneumoperitoneum was released and all trocars were removed.  Local anesthesia was infused;   4-0 Monocryl suture, closed the skin.  Steri-Strips and sterile dressings were placed.  Sponge, needle and instrument counts were correct x2 at the end of the operation.       PRESENCE STATEMENT:  I, Shirel Mallis B. Ophelia Charter, MD was present and participated in all aspects of the operation from incision to closure.                                                                              Fayette Pho, MD      Date Dictated: 09/05/2021    Date Transcribed: 09/05/2021    RBY/LW   Job #: [id number]

## 2021-09-05 NOTE — H&P (Signed)
Assessment/Plan     Principal Problem:    Bilateral inguinal hernia without obstruction or gangrene      Subjective     Eric Walls is an 25 year old male who presents with bilateral inguinal hernias.        Past Surgical History:   Procedure Laterality Date    PR ORAL SURGERY PROCEDURE         Objective     BP 121/76 (Patient Position: Lying)    Pulse 81    Temp 36.3 C (Tympanic)    Resp 18    Ht 6\' 3"  (1.905 m)    Wt 67.7 kg (149 lb 4 oz)    SpO2 100%    BMI 18.66 kg/m     Physical Exam   Constitutional: NAD. Conversing pleasantly     HEENT: NCAT  CV: Extremities warm, RRR  Pulm: Unlabored breathing, no accessory muscle usage, on RA  Abdomen: Site marked, non-distended, non-tender  Neuro: Alert and oriented. Motor and sensory grossly intact.  MSK: No edema in extremities.   Skin: No obvious rashes or lesions.   Psych: Mood and affect appropriate     A&P:  25 yo M who presents with bilateral inguinal hernias. Consent was signed in pre op area. Abdomen was marked. All patients questions were answered we will proceed with surgery as planned. Anticipate DC from PACU.     30, MD  General Surgery PGY-2  Pager #: [phone]

## 2021-09-05 NOTE — Discharge Instructions (Addendum)
Surgery: Robot assist bilateral inguinal hernia repair with mesh  Surgeon: Dr. Ophelia Charter  DOS: 09/05/21  Follow Up: 09/27/21    Post op Care: Symptom management   Pain Management    You should take Tylenol 1000 mg every 6 hours, and Ibuprofen 600 mg every 6 hours routinely for the first few days to week after surgery. You may decrease these medications as tolerated when you feel ready   Take narcotic prescription pain medication (if it was prescribed to you) as needed in addition to Tylenol, ibuprofen and Ice. Prescription pain medication can cause constipation, so take as little as you need to stay comfortable.   Icing helps with pain and swelling, apply ice to painful areas at least 4 times per day for 20 minutes at a time. You may ice more often if you like or tolerate  Stay on schedule with pain medication to ensure pain will stay manageable, especially during the first week.   Do not drink alcohol or drive while taking narcotic prescription medication.   Avoid taking pain meds on an empty stomach, as this may cause nausea.   If you have been able to go 7 days without using narcotic prescription pain medication, please dispose of it safely. Refer to this website for tips on safe disposal: [email or web address].[email or web address]-[email or web address]/  Constipation   Drink plenty of fluids   Walk as much as you can tolerate   Eat a high fiber diet (i.e. prunes, apples, bran)   We recommend you use Miralax daily until you resume normal bowel functions. Miralax can be safely used up to 3 times daily as needed.  If you have not had a bowel movement by post operative day 3-4, you can use milk of magnesia which you can buy over the counter.  Do not go more than 3-4 days without a bowel movement.  Incision Care   It is ok to shower, just gently clean the incisions with soap and water and pat the incisions dry.   Your incisions are covered with dermabond (skin glue), do not pick this off. It will fall off eventually on its own.  ***Your incisions are covered with steristrips, gauze and tegaderm. Gauze/tegaderm dressings should be removed 48 hours after you return home. Steri-strips (white tape strips) should remain in place until they fall off on their own. Try not to pick these off. They can be removed earlier if they become soiled or loose.   No tub bathing, hot tubs, or swimming for two weeks   Bruising and swelling is expected after this operation    Post-Operative Restrictions  We want you to walk and do other light activities but listen to your body. Back off on your activity level if you are having a sudden increase in pain or nausea/vomiting  Please wear your abdominal binder/jock strap at all times (except to shower) for the next 2 weeks.  Do not lift more than 20 pounds for the next two week or until your follow up appointment with your surgeon. Avoid heavy pushing, pulling or straining. At your post operative visit, they will guide you on how you can progress your activity. Avoid core exercises for 6 weeks  When you can go back to work depends on your job and how quickly you recover from surgery. If you have a more strenuous job, you may need to be out of work for 4-6 weeks          Hernia Repair and Your Physical Activity    Although  every patient is an individual and may require slight adjustment to the following guidelines, here is my typical approach to postoperative physical activity during recovery from outpatient/overnight stay hernia surgery:    The first week to 10 days after surgery  During this time, you won't feel like doing much elective physical activity at all. You should be able to walk around the house/apartment and climb stairs. This period of time is an opportunity to rest/relax and not do more physical activity than you need to to get through your day. You should be able to do your activities of daily living (bathe, toilet, cook, eat, etc) with minimal to no help.    10 days to 3 weeks after surgery  During  this time you should begin to feel quite a bit better than that first week after surgery. You may feel like going outside for a walk, maybe even get back on the treadmill, elliptical, or exercise bike. These activities are fine, as long as you begin them slowly and do not push yourself physically. This is a time to get a sense of how you are going to feel slowly incorporating what you enjoy doing back into your life after surgery.    3-4 weeks after surgery  I will plan to see you back in the office for your postoperative appointment. Assuming that your recovery is going well and there are no concerns about how you are healing, I will generally recommend that you being to advance your physical activity back towards 100%. That said, if you are only at 20% (or 30%, or 40%) of your normal activity, I don't want you jumping right back to 100%. If you are used to lifting 100 lbs but haven't lifted more than 20 lbs since surgery, don't make the next thing you lift be 100 lbs. So, the key is to go back slowly with small incremental increases in your activity. Shoot to be back to 100% by 6-8 weeks. This time varies depending on your preoperative level of activity -- some patients may be back to 100% by 4 weeks, others may take 8-10 weeks (depending on how active they are prior to surgery).    Until your postoperative appointment, I would like you to avoid lifting anything greater than 20 lbs and refrain from any significant lifting, pushing, pulling, straining, and strenuous core abdominal exercises to avoid putting stress/strain on the abdominal wall and hernia repair site.      Please Refer to The Abdominal [facility]) Rehabilitation Protocol    [email or web address]      - Listen to your body -- if something doesn't feel quite right or causes moderate/severe discomfort, then avoid that activity and give yourself some additional recovery prior to trying  that activity again.    - Go Slowly -- if there is a common theme to postoperative physical activity, it's don't push yourself in the first 1-2 months. Slowly increase your physical activity intensity back to your 100%. Shoot for being back to 100% somewhere between 4 and 8 weeks (though this will vary greatly based on what your baseline level of function/activity is prior to surgery).      [employer] [facility] [employer].  -Contact clinic for any concerns including new swelling, redness, drainage, pain, nausea, vomiting, increased  fatigue, fevers, chills, or pain that is not well controlled. Seek help immediately (911 or emergency room) if you experience chest pain,  shortness of breath, new numbness or  weakness, or new leg pain/ swelling/ redness.  -To speak with the general surgery schedulers or clinic staff (MA/RN), call [phone] and select option 2 after  the initial automated message. If you have a question requiring medical advice, the front desk staff will take your  message, and a RN or MA will return your call. Phone calls will be returned based on urgency,  generally within 24 hours for all calls.  -For non-urgent issues, you can send an eCare message to your surgeon's team, and you can expect a response  generally within 24-48 hours.  -For urgent concerns after-hours, call [phone] to speak with the operator and ask to speak to the general  surgery resident doctor on call.  -If you are unable to reach your [facility] providers and have an emergent issue, go to your local ER or call 911  immediately.       POST ANESTHESIA GUIDELINES    You have received sedation or an anesthetic today for your surgery. Therefore there are some guidelines we ask that you follow for your safety and well-being:  You must have a responsible adult escort you home or the the place where you will stay while you recover from surgery.   You cannot drive yourself.  You cannot take a taxi or bus by  yourself.   Your escort must help you get into your home or place of recovery and  help you settle in.     The First 24 Hours  We strongly advise that a responsible adult stay with you for 24 hours after discharge. This adult should be able to help take care of you at home or in your place of recovery. This is for you safety, in case you have any problems and need extra care after your surgery today. If you choose not to follow this advice, [facility] is not responsible for your safety.   You can expect to have some pain and maybe some nausea after surgery. You may also be sleepy for the rest of the afternoon.   For 24 hours after having anesthesia, DO NOT:   Drive  Drink alcohol / No Smoking / No Vaping / No Marijuana Products or Recreational drugs of any kind.  Travel alone  Use machinery  Sign any legal papers  Be responsible for children, pets, or an adult who needs care

## 2021-09-27 ENCOUNTER — Encounter (INDEPENDENT_AMBULATORY_CARE_PROVIDER_SITE_OTHER): Payer: Self-pay

## 2021-09-27 ENCOUNTER — Encounter (INDEPENDENT_AMBULATORY_CARE_PROVIDER_SITE_OTHER): Payer: Self-pay | Admitting: Surgery

## 2021-09-27 ENCOUNTER — Ambulatory Visit (INDEPENDENT_AMBULATORY_CARE_PROVIDER_SITE_OTHER): Payer: 59 | Admitting: Surgery

## 2021-09-27 VITALS — BP 115/60 | HR 77 | Temp 97.9°F | Ht 75.0 in | Wt 158.0 lb

## 2021-09-27 DIAGNOSIS — M25552 Pain in left hip: Secondary | ICD-10-CM

## 2021-09-27 DIAGNOSIS — K4020 Bilateral inguinal hernia, without obstruction or gangrene, not specified as recurrent: Secondary | ICD-10-CM

## 2021-09-27 NOTE — Progress Notes (Signed)
[facility]    Postoperative Clinic Visit      History of Present Illness    Zaylon Bossier has a history of bilateral inguinal hernia and is now status post RA lap repair.    He returns today for an initial postoperative appointment.      Interval History  Since her operation, she has been doing very well. His preoperative symptoms have essentially resolved. He notes a bit of discomfort still on his right side that is a pinched feeling. This comes on mostly with walking on the treadmill.    Additionally, she has developed no new symptoms.    He has had return of normal bowel and urinary function without issues. Taking miralax as needed.      Exam  Ht 6\' 3"  (1.905 m) Wt 158 lb (71.668 kg) Body mass index is 19.75 kg/m.  BP 115/60    Pulse 77    Temp 36.6 C (Temporal)    Ht 6\' 3"  (1.905 m)    Wt 71.7 kg (158 lb)    SpO2 100%    BMI 19.75 kg/m     Abdomen:  All incisions are well healed without evidence of infection or hernia. On palpation, abdomen is non-tender and non-distended. There is no evidence of recurrent inguinal hernia.      Assessment and Plan  Fredrico Beedle is doing very well status post RA lap bilateral inguinal hernia repair.    We discussed the ongoing right groin discomfort. This may be related to a non-hernia source. It may also be related to recovery from surgery. Regardless, if it does not get better in the next month or two I would recommend    I discussed anticipatory guidance with regards to physical activity. Specifically, she should refrain from lifting >15 lbs and avoid significant pushing/pulling/straining for 3 weeks after the hernia repair. Otherwise, activity should be as tolerated: she can return to normal physical activity without restriction as long as she does not note any discomfort.    I will plan to see the patient back on an as-needed basis.      Kristion Holifield B. , MD  Clinical Associate Professor  [facility]  [address] [facility] [address]  [place] #[id number]    Phone: [phone]  Fax: [phone]

## 2021-09-27 NOTE — Patient Instructions (Signed)
[facility]    Thank you for choosing the [place] [facility] for your care.    We take your perspective on your care seriously and appreciate your feedback on your healthcare experience:    If you receive a survey from the [facility], we appreciate you taking the time to complete it. Our goal is to be rated as a 9-10 or "always" on the survey.  If you do not feel that we have met or exceeded your expectations, we would like very much to know how we can make sure your future experiences are better.    If you would like to provide [place] additional feedback on your healthcare experience with Dr. Lorin Mercy and the [facility], you can do so at the following websites:    HealthGrades: [email or web address]    Vitals: [email or web address]      As discussed during your clinic appointment, you are doing very well following your recent operation, and we hope that you continue to feel well during the remainder of your recovery.    Physical Activity  Other than the exceptions listed below, you can return to your normal physical activity as long as you do not notice any discomfort at your incision sites. If an activity causes you pain, then discontinue that activity and wait 5-7 days before attempting that activity again.    Examples of activities that are totally safe to perform at this point after your surgery:    - Walking  - Climbing stairs  - Hiking  - Cycling/Biking  - Yard work  - [employer]  - JEconomistactivities do not have to be limited to those listed above, these are simply examples of types of activities that are frequently asked about by patients.    Exceptions to Physical Activity:    Please DO NOT LIFT >25 lbs or do strenuous core-type exercises (for example: crunches/sit-ups, intense yoga or pilates) for a  total of 4 weeks after your operation, unless otherwise discussed with your physician. After 4 weeks following your operation, you can return to all types of physical activity (including lifting >25 lbs and core strengthening).    If you are used to being very physically active -- either in recreational activities, sports or for your work -- then we recommend that you gradually return to your full, pre-operative level of physical activity beginning at 4 weeks following your hernia surgery. Again, use pain/discomfort as your guide: If an activity does not cause you pain, then it's okay to perform that activity.    At 2 weeks postoperatively, you can bathe in a bath tub, soak in a hot tub, and go swimming without restrictions    Abdominal Core Surgery Rehabilitation    Please visit:  [email or web address] the full pre- and post-operative core rehabilitation protocols    After undergoing abdominal wall reconstruction or hernia repair, your body has a new ability to stabilize its core. Rehabilitation of your core after these operations is important to maintain function and flexibility and can help reduce pain. These guides will help you regain function and restore abdominal core health. If you have not already done so, you should consult with your surgeon before you start the program described in these guides.    Some of the topics covered include:    Self-Care for Healing and Recovery  After your surgery, your abdominal tissues will continue healing for several  months. The first four weeks are when you are most susceptible to re-injury. This section includes information to help you protect your hernia repair and reduce your risk of developing health problems during your recovery.    Stretching  Stretching helps reduce discomfort, improve your blood circulation, and maintain abdominal core movement. It also helps reduce scarring and scar tissue. This section includes  stretches with tips to protect your hernia repair.    Activities of Daily Living  Basic activities are usually more difficult immediately after surgery. This section includes techniques to protect your hernia repair during routine daily activities such as sleeping, walking, etc.    Exercises  Developing weakness after surgery is very common and can prolong your recovery. This section contains exercises that will help you reduce strength loss while avoiding excess stress to your hernia repair.      Hernia Repair and Your Physical Activity    One of the most common topics of conversation when it comes to recovery after hernia surgery is patient physical activity.    And one of the most common questions that patients ask is, "What are my physical activity restrictions after surgery?"    I try to strike a balance between being slightly protective to allow the hernia repair site to heal appropriately and not being overly restrictive to physical activity.     Although every patient is an individual and may require slight adjustment to the following guidelines, here is my typical approach to postoperative physical activity during recovery from outpatient/overnight stay hernia surgery:    3-4 weeks after surgery  I will plan to see you back in the office for your postoperative appointment. Assuming that your recovery is going well and there are no concerns about how you are healing, I will generally recommend that you being to advance your physical activity back towards 100%. That said, if you are only at 20% (or 30%, or 40%) of your normal activity, I don't want you jumping right back to 100%. If you are used to lifting 100 lbs but haven't lifted more than 20 lbs since surgery, don't make the next thing you lift be 100 lbs. So, the key is to go back slowly with small incremental increases in your activity. Shoot to be back to 100% by 6-8 weeks. This time varies depending on your preoperative level of activity -- some  patients may be back to 100% by 4 weeks, others may take 8-10 weeks (depending on how active they are prior to surgery).    Until your postoperative appointment, I would like you to avoid lifting anything greater than 20 lbs and refrain from any significant lifting, pushing, pulling, straining, and strenuous core abdominal exercises to avoid putting stress/strain on the abdominal wall and hernia repair site.      Please Refer to The Abdominal Core Health [facility]) Rehabilitation Protocol    [email or web address]      A couple final thoughts:    - Listen to your body -- if something doesn't feel quite right or causes moderate/severe discomfort, then avoid that activity and give yourself some additional recovery prior to trying that activity again.    - Go Slowly -- if there is a common theme to postoperative physical activity, it's don't push yourself in the first 1-2 months. Slowly increase your physical activity intensity back to your 100%. Shoot for being back to 100% somewhere between 4 and 8 weeks (though this will vary greatly based on what your baseline level  of function/activity is prior to surgery).          Optimal Postoperative Pain Management Strategies after Hernia Repair    1. During the first 24 hours after your surgery, you can place an ice pack over your surgical  region. This can help reduce swelling and discomfort.    2. Take 2-3 tablets of Tylenol 325 mg (or generic acetaminophen) every 6 hours for 3 days,  then only as needed to control discomfort. [DO NOT exceed 4000 mg in a 24-hour  period, as this can be damaging to your liver]    3. Take 2-3 tablets of Advil or Motrin 275m (or generic ibuprofen) every 6 hours with  food or milk for 3 days, then only as needed to control discomfort. [DO NOT use  Advil/Motrin if you have a history of stomach or intestinal ulcers or have had problems  taking aspirin in the past]    4. You may stagger  Tylenol and Advil so that you are taking something every three hours,  or you may take them together every 6 hours - its your choice    5. Only if you are still having pain that restricts you from sleeping or getting out of bed,  take 1 tablet of oxycodone 562m(or other prescribed opioid) every 4-6 hours as  needed for discomfort that remains after taking Tylenol and Advil.    Many people do not need opioids to manage their post-surgical pain, so you might  choose not to fill the prescription or fill only part of your prescription. If you use an  opioid (narcotic), you must beware of becoming drowsy or inattentive, and you will not  be able to drive or operate heavy machinery. Additional side effects include dizziness,  lightheadedness, constipation, nausea, and vomiting        Wound Care After Surgery  Your incision sites will be covered in several layers with two dressings.    On the outside (visible) will be a clear plastic dressing with a gauze underneath. This dressing and gauze should be removed 48 hrs after surgery (postoperative day 2). Just simply peel this dressing off of your skin. It is important to remove this dressing 48 hrs after surgery to avoid bacterial overgrowth under the dressing.    Once you remove the outer dressing, you will see several small steri-strips (butterfly dressings). These are 1/2" in width and 1-2" long. These can be gently peeled off your skin 7 days after surgery. It is important to remove these after 7 days. If they fall off prior to 7 days, that's okay; just leave the incision open to air.    You will have sutures in your skin that you cannot see (below the skin). These sutures will dissolve on their own; you will not need any sutures removed.    If you have a larger incision that is closed with surgical staples, these will need to be removed approximately 14 days after your surgery date. Laparoscopic surgery and smaller incisions are usually closed with suture only; longer  incisions will be closed with staples.      Additional Information  Here are some additional websites that can provide you some additional information regarding hernia repair:    https://[email or web address]/publications/patient-information/[email or web address]-repair-surgery-patient-information-from-sages/    https://[email or web address].org/publications/patient-information/patient-information-for-laparoscopic-ventral-hernia-repair-from-sages/      Follow-Up Appointments:  You can follow-up with [facility]orean the [facility] an as needed basis. You do not need any specific follow-up appointment scheduled.  Of course, if you have any questions or concerns related to your hernia repair, please call the clinic and schedule an appointment with me. I am happy to see you in person for a re-evaluation at any point in the future.    You can also contact our clinic anytime, or you can contact me via e-care on your patient portal through the [facility] system (instructions on sign up for e-care are included in this paperwork).      Zariah Jost B. Lorin Mercy, MD  Clinical Associate Professor  [facility]  [address] [place]  [place] [address]    Phone: [phone]  Fax: [phone]

## 2021-12-10 ENCOUNTER — Ambulatory Visit (INDEPENDENT_AMBULATORY_CARE_PROVIDER_SITE_OTHER): Payer: BLUE CROSS/BLUE SHIELD | Admitting: Internal Medicine

## 2021-12-10 ENCOUNTER — Encounter (INDEPENDENT_AMBULATORY_CARE_PROVIDER_SITE_OTHER): Payer: Self-pay | Admitting: Internal Medicine

## 2021-12-10 VITALS — BP 125/69 | HR 81 | Temp 97.6°F | Resp 14 | Ht 73.33 in | Wt 156.0 lb

## 2021-12-10 DIAGNOSIS — Z0000 Encounter for general adult medical examination without abnormal findings: Secondary | ICD-10-CM

## 2021-12-10 LAB — CBC, DIFF
% Basophils: 1 %
% Eosinophils: 1 %
% Immature Granulocytes: 0 %
% Lymphocytes: 25 %
% Monocytes: 9 %
% Neutrophils: 64 %
% Nucleated RBC: 0 %
Absolute Eosinophil Count: 0.1 10*3/uL (ref 0.00–0.50)
Absolute Lymphocyte Count: 1.88 10*3/uL (ref 1.00–4.80)
Basophils: 0.05 10*3/uL (ref 0.00–0.20)
Hematocrit: 44 % (ref 38.0–50.0)
Hemoglobin: 14.7 g/dL (ref 13.0–18.0)
Immature Granulocytes: 0.03 10*3/uL (ref 0.00–0.05)
MCH: 31.1 pg (ref 27.3–33.6)
MCHC: 33.5 g/dL (ref 32.2–36.5)
MCV: 93 fL (ref 81–98)
Monocytes: 0.64 10*3/uL (ref 0.00–0.80)
Neutrophils: 4.83 10*3/uL (ref 1.80–7.00)
Nucleated RBC: 0 10*3/uL
Platelet Count: 213 10*3/uL (ref 150–400)
RBC: 4.73 10*6/uL (ref 4.40–5.60)
RDW-CV: 11.9 % (ref 11.0–14.5)
WBC: 7.53 10*3/uL (ref 4.3–10.0)

## 2021-12-10 LAB — COMPREHENSIVE METABOLIC PANEL
ALT (GPT): 14 U/L (ref 10–64)
AST (GOT): 16 U/L (ref 9–38)
Albumin: 4.8 g/dL (ref 3.5–5.2)
Alkaline Phosphatase (Total): 42 U/L (ref 35–109)
Anion Gap: 11 (ref 4–12)
Bilirubin (Total): 0.7 mg/dL (ref 0.2–1.3)
Calcium: 9.6 mg/dL (ref 8.9–10.2)
Carbon Dioxide, Total: 26 meq/L (ref 22–32)
Chloride: 104 meq/L (ref 98–108)
Creatinine: 1.06 mg/dL (ref 0.51–1.18)
Glucose: 85 mg/dL (ref 62–125)
Potassium: 4 meq/L (ref 3.6–5.2)
Protein (Total): 7.4 g/dL (ref 6.0–8.2)
Sodium: 141 meq/L (ref 135–145)
Urea Nitrogen: 19 mg/dL (ref 8–21)
eGFR by CKD-EPI 2021: >60 mL/min/{1.73_m2} (ref >59)

## 2021-12-10 NOTE — Progress Notes (Signed)
SUBJECTIVE:    Eric MooresHugh Mark is a 26 year old male here for a wellness/preventive health care visit.  Additional concerns: none    Current dietary habits: healthy diet in general  Current exercise habits: aerobics  Regular seat belt use: YES  History sections of chart reviewed and updated today: yes    Patient Active Problem List    Diagnosis Date Noted    Bilateral inguinal hernia without obstruction or gangrene [K40.20] 07/26/2021     Added automatically from request for surgery [id number]         Current Outpatient Medications   Medication Sig Dispense Refill    acetaminophen 500 MG tablet Take 2 tablets (1,000 mg) by mouth every 6 hours as needed for pain. 100 tablet 0    loratadine 10 MG tablet Take 1 tablet (10 mg) by mouth.       No current facility-administered medications for this visit.        REVIEW OF SYSTEMS:  CONSTITUTIONAL: Negative for, fatigue, unexpected weight loss, unexpected weight gain  NEUROLOGIC: Negative for, headaches, dizziness, syncope and tremors  OPHTHALMIC: Negative for, any vision problems, blurry vision, diplopia, eye pain  ENT: Negative for and any ear nose or throat problems  RESPIRATORY: Negative for, dyspnea on exertion, dyspnea at rest, cough, wheezing  CARDIOVASCULAR: Negative for, chest pain or pressure at rest or during exercise, palpitations, dyspnea, orthopnea, paroxysmal nocturnal dyspnea  GI: Negative for, change in appetite, dysphagia, nausea, vomiting, dyspepsia, abdominal pain, diarrhea, constipation, hematochezia, melena  GENITOURINARY: Negative for, urethral discharge, erectile dysfunction, dysuria, any genito-urinary problems  INTEGUMENTARY: Negative for, changes in moles or new moles, any skin problems  RHEUMATOLOGIC/MSK: Negative for, joint swelling, joint stiffness, any joint or arthritic problems  ENDOCRINE: Negative for, polyuria, polydipsia, heat intolerance, cold intolerance  PSYCHOSOCIAL: Negative for, depressed mood, sleep disturbance, anxiety and  Denies      PHYSICAL EXAM:  BP 125/69    Pulse 81    Temp 36.4 C (Temporal)    Resp 14    Ht 6' 1.33" (1.863 m)    Wt 70.8 kg (156 lb)    SpO2 99%    BMI 20.40 kg/m    General: no acute distress  Skin: Skin color, texture, turgor normal. No rashes or concerning lesions  Head: Normocephalic. No masses, lesions, tenderness or abnormalities  Eyes: Lids/periorbital skin normal, Conjunctivae/corneas clear, PERRL, EOM's intact  Ears: External ears normal. Canals clear. TM's normal.  Neck: supple. No adenopathy. Thyroid symmetric, normal size, without nodules  Lungs: clear to auscultation  Heart: normal rate, regular rhythm and no murmurs, clicks, or gallops  Abd: soft, non-tender. BS normal. No masses or organomegaly  GU: deferred  Rectal: deferred  Prostate: deferred  Spine: Back symmetric, no deformity; ROM normal; No CVA tenderness.  Ext: Normal, without deformities, edema, or skin discoloration  Neuro:  Grossly normal to observation, gait normal  -----------------------------------------    ASSESSMENT AND PLAN:    (Z00.00) Wellness examination  (primary encounter diagnosis)  Plan: CBC with Diff, Comprehensive Metabolic Panel           Pt notes he wants to get his records before getting any vaccination

## 2021-12-10 NOTE — Progress Notes (Signed)
Health Maintenance Due   Topic    Hepatitis C Screening     Hepatitis B Vaccine (1 of 3 - 3-dose series)    COVID-19 Vaccine (1)    HPV Vaccine (1 - Male 2-dose series)    Depression Screening (PHQ-2)     HIV Screening     DTaP, Tdap, and Td Vaccines (1 - Tdap)    Influenza Vaccine (1)       WAIIS/Care Everywhere/[facility] have been reconciled in Epic: YES  HM DUE :   Hepatitis C Screening  Hepatitis B Vaccine(1 of 3 - 3-dose series)  COVID-19 Vaccine(1)  HPV Vaccine(1 - Male 2-dose series)  Depression Screening (PHQ-2)  HIV Screening  DTaP, Tdap, and Td Vaccines(1 - Tdap)  Influenza Vaccine(1)  Rowland Lathe, CMA, CMA  12/10/2021  will get vaccines at work.  OUTREACH:

## 2021-12-22 ENCOUNTER — Ambulatory Visit (INDEPENDENT_AMBULATORY_CARE_PROVIDER_SITE_OTHER): Payer: 59 | Admitting: Internal Medicine

## 2022-08-07 ENCOUNTER — Other Ambulatory Visit: Payer: Self-pay

## 2023-02-18 ENCOUNTER — Encounter (INDEPENDENT_AMBULATORY_CARE_PROVIDER_SITE_OTHER): Payer: Self-pay

## 2023-08-08 ENCOUNTER — Encounter (INDEPENDENT_AMBULATORY_CARE_PROVIDER_SITE_OTHER): Payer: Self-pay

## 2023-09-02 ENCOUNTER — Telehealth: Payer: 59 | Admitting: Family Medicine

## 2023-09-02 ENCOUNTER — Encounter (INDEPENDENT_AMBULATORY_CARE_PROVIDER_SITE_OTHER): Payer: Self-pay

## 2023-09-02 DIAGNOSIS — F988 Other specified behavioral and emotional disorders with onset usually occurring in childhood and adolescence: Secondary | ICD-10-CM | POA: Insufficient documentation

## 2023-09-02 DIAGNOSIS — J329 Chronic sinusitis, unspecified: Secondary | ICD-10-CM | POA: Insufficient documentation

## 2023-09-02 DIAGNOSIS — Z8719 Personal history of other diseases of the digestive system: Secondary | ICD-10-CM

## 2023-09-02 DIAGNOSIS — R1031 Right lower quadrant pain: Secondary | ICD-10-CM

## 2023-09-02 DIAGNOSIS — Z9889 Other specified postprocedural states: Secondary | ICD-10-CM

## 2023-09-02 NOTE — Progress Notes (Signed)
[facility]       Distant Site Telemedicine Encounter  I conducted this encounter via secure, live, face-to-face video conference with the patient. I reviewed the risks and benefits of telemedicine as pertinent to this visit and the patient agreed to proceed.    Provider Location: Off-site location (home, non-[place] location)  Patient Location: At home    Present with patient: No one else present     Chief Complaint   Patient presents with    Hernia Evaluation     "Ongoing pain/discomfort after hernia surgery (2 years ago)"    The patient's identification was verified by using two patient identifiers and the following: government photo ID on file in chart.        Subjective:     Eric Walls is a 27 year old male who presents on 09/02/2023.    Right inguinal pain: had bilateral inguinal hernia repair about 2 years ago. Feels weaker and has pain in the right inguinal region. The pain in his right inguinal region was decreased after the surgery but it never went away. There is a lump on the right side and it seems to have grown in size. The pain/discomfort is worsened by activity. He would like to go back to the surgeon who performed the surgery-- Dr. Levander Campion at [facility].      Review of Systems        Objective:    Vitals: There were no vitals taken for this visit.    Physical Exam   General: well appearing  HEENT: normocephalic/atraumatic  Pulmonary: speaking in full sentences  CV: well perfused            Assessment and Plan:    Eric Walls was seen today for hernia evaluation.    Right inguinal pain  Decreased after the bilateral inguinal hernia repair but never resolved.  Will refer patient to Dr. Ophelia Charter who performed the surgery on 09/05/2021 for further evaluation and management.  -     Referral to General Surgery; Future    History of bilateral inguinal hernias  S/p repair on 09/05/2021.    Status post bilateral hernia repair  Completed on 09/05/2021.

## 2023-09-02 NOTE — Patient Instructions (Signed)
How to connect with the virtual care team:   If you need to reach your Virtual Primary Care Team, please login to MyChart and send us a message. For more timely needs, please call us at [phone]. Our team is available to assist you from 8:00 am to 6:00 pm Monday through Friday.

## 2023-09-02 NOTE — Telephone Encounter (Signed)
Insurance updated and active in chart. Pt notified.

## 2023-09-03 ENCOUNTER — Other Ambulatory Visit: Payer: Self-pay

## 2023-09-12 ENCOUNTER — Encounter (INDEPENDENT_AMBULATORY_CARE_PROVIDER_SITE_OTHER): Payer: Self-pay

## 2023-10-07 ENCOUNTER — Encounter (INDEPENDENT_AMBULATORY_CARE_PROVIDER_SITE_OTHER): Payer: 59 | Admitting: Surgery

## 2023-10-14 ENCOUNTER — Ambulatory Visit (INDEPENDENT_AMBULATORY_CARE_PROVIDER_SITE_OTHER): Payer: 59 | Admitting: Surgery

## 2023-10-14 VITALS — BP 112/72 | HR 80 | Temp 98.3°F | Ht 75.0 in | Wt 167.2 lb

## 2023-10-14 DIAGNOSIS — R1900 Intra-abdominal and pelvic swelling, mass and lump, unspecified site: Secondary | ICD-10-CM

## 2023-10-14 DIAGNOSIS — R1031 Right lower quadrant pain: Secondary | ICD-10-CM

## 2023-10-14 NOTE — Progress Notes (Signed)
 [facility]     History and Physical Exam    Chief Complaint: Right groin pain s/p RA lap bilateral inguinal hernia repair    Referring Physician: Wilbert Mount, MD    Eric Walls is a 27 year old man    2022 RA lap bilateral inguinal hernia repair    Preop some right groin discomfort and right testicular discomfort.    The symptoms now are very similar to what he experienced preoperatively, but now notes that pain is more cephalad to the pain where it was located in the past.    Normal daily activities seem to be okay, but when he is exerting himself at the gym or with his physical activities like hiking/backpacking then he notes the discomfort more.    Previous Abdominal Operations:  RA lap bilateral inguinal hernia repair     Previous Imaging Studies Related to the Chief Complaint:  None    REVIEW OF SYSTEMS:  The patient was provided a complete review of systems, which was completed in its entirety. This complete review of systems was reviewed by myself today in clinic with the patient. The patient self-reported form is scanned into the patient's electronic medical record. The positives and pertinent negatives are also noted in the history of present illness, above.    I personally reviewed and confirmed the ROS, past medical history, past surgical history, family history, and social history in the record with the patient today.    ALLERGIES:  Patient has no known allergies.    CURRENT MEDICATIONS:  Current Outpatient Medications   Medication Sig Dispense Refill    loratadine 10 MG tablet Take 1 tablet (10 mg) by mouth.       No current facility-administered medications for this visit.       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    Ventral hernia      @PROBLEMLIST @    Past Surgical History:   Procedure Laterality Date    HERNIA REPAIR      PR ORAL SURGERY PROCEDURE         Family History       No data  available            SOCIAL HISTORY:  Social History     Socioeconomic History    Marital status: Single     Spouse name: Not on file    Number of children: Not on file    Years of education: Not on file    Highest education level: Not on file   Occupational History    Not on file   Tobacco Use    Smoking status: Some Days     Types: Cigarettes    Smokeless tobacco: Current   Substance and Sexual Activity    Alcohol use: Yes     Alcohol/week: 7.0 standard drinks of alcohol     Types: 5 Cans of beer, 2 Shots of liquor per week    Drug use: Yes     Frequency: 7.0 times per week     Types: Marijuana    Sexual activity: Yes     Partners: Female   Other Topics Concern    Not on file   Social History Narrative    Not on file     Social Drivers of Health     Financial Resource Strain: Not on file  Food Insecurity: Not on file   Transportation Needs: Not on file   Physical Activity: Not on file   Stress: Not on file   Social Connections: Not on file   Intimate Partner Violence: Not on file   Housing Stability: Not on file        EXAMINATION:  Vitals:    10/14/23 1257   BP: 112/72   BP Cuff Size: Large   BP Site: Left Arm   BP Position: Sitting   Pulse: 80   Temp: 36.8 C   TempSrc: Temporal   SpO2: 98%   Weight: 75.8 kg (167 lb 3.2 oz)   Height: 6' 3 (1.905 m)     Body mass index is 20.9 kg/m.    PHYSICAL EXAMINATION  Awake, alert, comfortable  Heart Regular  Breathing Unlabored  Abdomen: Well healed incisions; no hernias  Right Groin: No hernia  Left Groin: No hernia  No edema          IMPRESSION:  Eric Walls is a 27 year old man with right groin and testicular discomfort 2 years following RA lap bilateral inguinal ehrnia repair.    I don't feel any evidence of a recurrent hernia on exam. However, he does have some movement in the area where a Spigelian hernia would be on the right side and this corresponds with his discomfort.    I am recommending a CT scan and I'll call him with the results.      PLAN:  CT scan  pelvis  If no hernia related issue, plan for sports medicine    Jamahl Lemmons B. Barbarann, MD, [facility]  Clinical Associate Professor  [facility]  [facility]  [place], [place]    Phone: [phone]  Fax: [phone]    [email or web address]

## 2023-11-06 ENCOUNTER — Other Ambulatory Visit: Payer: Self-pay

## 2023-11-07 ENCOUNTER — Ambulatory Visit: Payer: 59

## 2023-11-07 ENCOUNTER — Telehealth (HOSPITAL_BASED_OUTPATIENT_CLINIC_OR_DEPARTMENT_OTHER): Payer: Self-pay

## 2023-11-07 ENCOUNTER — Other Ambulatory Visit (INDEPENDENT_AMBULATORY_CARE_PROVIDER_SITE_OTHER): Payer: Self-pay

## 2023-11-07 NOTE — Telephone Encounter (Signed)
 RETURN CALL: Voicemail - Detailed Message      SUBJECT:  Appointment Request     REASON FOR VISIT: General pain on hip, pain groin area; Establish care  PREFERRED DATE/TIME: As soon as possible/Open  REASON UNABLE TO APPOINT: Unable to appoint, no available templates online with any provider. Pls assist. Thanks!

## 2023-11-25 ENCOUNTER — Encounter (INDEPENDENT_AMBULATORY_CARE_PROVIDER_SITE_OTHER): Payer: 59 | Admitting: Surgery

## 2024-01-29 ENCOUNTER — Encounter (INDEPENDENT_AMBULATORY_CARE_PROVIDER_SITE_OTHER): Payer: Self-pay

## 2024-02-25 ENCOUNTER — Encounter (INDEPENDENT_AMBULATORY_CARE_PROVIDER_SITE_OTHER): Payer: Self-pay

## 2024-03-23 ENCOUNTER — Encounter (INDEPENDENT_AMBULATORY_CARE_PROVIDER_SITE_OTHER): Payer: Self-pay

## 2024-04-01 ENCOUNTER — Encounter (INDEPENDENT_AMBULATORY_CARE_PROVIDER_SITE_OTHER): Payer: Self-pay

## 2024-04-01 ENCOUNTER — Ambulatory Visit (INDEPENDENT_AMBULATORY_CARE_PROVIDER_SITE_OTHER): Admitting: Family

## 2024-04-01 ENCOUNTER — Inpatient Hospital Stay (INDEPENDENT_AMBULATORY_CARE_PROVIDER_SITE_OTHER): Admit: 2024-04-01 | Discharge: 2024-04-01 | Disposition: A | Discharge status: Home / Self Care

## 2024-04-01 VITALS — BP 116/67 | HR 74 | Temp 99.5°F | Resp 16

## 2024-04-01 DIAGNOSIS — R0781 Pleurodynia: Secondary | ICD-10-CM

## 2024-04-01 NOTE — Patient Instructions (Signed)
 Ibuprofen  400-600 mg up to four times daily as needed for pain   If needed this can be augmented with Tylenol  [phone] mg up to three times daily as needed for pain   Please use ice/heat, rest, avoid heavy lifting. Make sure you take deep breath once an hour. Sometimes splinting with a pillow can be helpful.     Thank you for visiting [facility] today. If your symptoms worsen or do not improve please follow up with your primary care provider or go to the nearest ER.       [facility]  Monday-Friday 10am-8pm  Saturday and Sunday 8am-6pm

## 2024-04-01 NOTE — Progress Notes (Signed)
 Chief Complaint   Patient presents with    Rib Injury     Pos Lower Right side rib injury 03-25-2024 / Prev fall / lingering Right flank and middle back pain / clicking sensation           Subjective:     Eric Walls is a 28 year old male who presents on 04/01/2024.    Pt presents for right flank pain since 03/25/24. He was in the [place] on a boat for a week. He slipped on the boat and landed on the right side. No associated head trauma. He got up and went to bed. He developed pain the following pain but was able to continue his regular activities. Pain is rated at a moderate. Pain is worse with sleeping on the side, cough, laughing, sneezing, evening, movement. Pain is better with ibuprofen  and Tylenol . Calves have been feeling stiff.   Pt denies any shortness of breath, wheezing, fever, chills, nausea or vomiting, lower leg edema, blood in the urine.   HPI   Review of Systems   Patient Active Problem List   Diagnosis    Bilateral inguinal hernia without obstruction or gangrene    ADD (attention deficit disorder)    Frequent sinus infections              Objective:    Vitals: BP 116/67   Pulse 74   Temp 37.5 C (Temporal)   Resp 16   SpO2 98%   Physical Exam  Cardiovascular:      Rate and Rhythm: Normal rate and regular rhythm.   Pulmonary:      Effort: Pulmonary effort is normal.      Breath sounds: Normal breath sounds.      Comments: Tenderness to palpation over right 7,8 at midaxillary line  Neurological:      Mental Status: She is alert.       Imaging: I reviewed the images. According to my interpretation there is no fracture.   ALL X-rays are also reviewed by a Board Certified Radiologist and you will receive a call if there are any findings noted by the radiologist that were not discussed with you at the time of your visit.            Assessment and Plan:      [place] was seen today for rib injury.    Rib pain on right side  Wet read of xray without any fractures but final xray read pending. Will  advise patient of final read once it is available.   Ibuprofen /Tylenol   May use ice/heat  Advised to take deep breath once an hour, avoid impact sport  Advised of red flags  Follow up if symptoms do not improve or worsen    -     XR Ribs Unilat 2 View W Chest Right; Future         Patient Instructions   Ibuprofen  400-600 mg up to four times daily as needed for pain   If needed this can be augmented with Tylenol  [phone] mg up to three times daily as needed for pain   Please use ice/heat, rest, avoid heavy lifting. Make sure you take deep breath once an hour. Sometimes splinting with a pillow can be helpful.     Thank you for visiting [facility] today. If your symptoms worsen or do not improve please follow up with your primary care provider or go to the nearest ER.  [facility]  Monday-Friday 10am-8pm  Saturday and Sunday 8am-6pm

## 2024-05-18 ENCOUNTER — Encounter (INDEPENDENT_AMBULATORY_CARE_PROVIDER_SITE_OTHER): Payer: Self-pay

## 2024-06-03 NOTE — Progress Notes (Signed)
 [facility]      CHIEF COMPLAINT:  No chief complaint on file.    Eric Walls is a 28 year old male who presents today to establish care.    ISSUES DISCUSSED:     PMH   Seasonal allergies takes loratadine 10 mg daily     PSH   Bilateral inguinal hernia repair 08/2021 w/ Dr. Barbarann at [facility].     Healthcare Maintenance  General:  - From [place] , moved here for work. Enjoying  [place]. Hiking, camping, dirt bikes. Parents in [place], sister in [place]. Finding community here. Works in Producer, television/film/video.   - Mental Health: Shares that his father has ALS, and is in the terminal stages of disease.  The setting of this he has been having more days where he feels down, depressed.  However, feels that this is appropriate response to his circumstances, and that he is able to cope with these feelings.  Health Screening: Depression (Submitted on 06/01/2024)  PHQ2 Score: 2  Health Screening: Depression (Submitted on 06/01/2024)  PHQ9 SCORE: 7  - Substance Use:   - Tobacco: Using zyn, around a can per day.   - Alcohol: Does not drink during the week.  On the weekends will occasionally go out with friends and have 5-6 drinks.    - Drugs: Daily marijuana use.  - Sleep: Struggles to fall asleep, no trouble staying asleep.   - Diet: Trying to be more conscious about what he is eating. Eating less fast food, cooking more chicken and vegetables.   - Exercise: Goes to the gym, 4 days per week.     Cancer screening:  - Colon: No family history or alarm symptoms  - Prostate: No family history or alarm symptoms    Metabolic/CV:  - HTN Screen: Family history of hypertension  BP Readings from Last 3 Encounters:   06/04/24 133/77   04/01/24 116/67   10/14/23 112/72   - DM Screen: Family history of diabetes.  - Lipid Screen: Family history of elevated cholesterol.    Infectious disease:  Interested in screening today.    Immunizations:  Fully immunized as a child, but received these vaccines in [place] .    Right sided  pain  He reports after undergoing a hernia repair in 2022 he developed right sided low back pain that radiates to the front into the groin.  This pain is achy, and is alleviated by stretching.  However, he also has intermittent pain in R testicle. Pain at the testicle is a dull ache/discomfort. No clear aggravating factors.  He has also noticed urinary symptoms.  He feels that his urine smells different than prior. No dysuria, but has had difficulty emptying his bladder, as well as increased urinary frequency and urgency. No pain/difficulty with bowel movements. Overall the symptoms have been stable over the past few years, without marked worsening.    ROS:  As per HPI. The remaining review of systems was reviewed and is negative.    PHYSICAL EXAM:   BP 133/77   Pulse 70   Temp 36.2 C (Temporal)   Ht 6' 2 (1.88 m)   Wt 77.7 kg (171 lb 3.2 oz)   SpO2 99%   BMI 21.98 kg/m     GEN: Well-appearing, comfortable, NAD  HEENT: Head atraumatic, normocephalic. Sclera anicteric, no conjunctival injection.   RESP: Normal respiratory effort.  Sounds present, clear throughout.  CV: Normal S1 and S2. RRR.   GI: Soft, non-tender, non-distended.  MSK: No atrophy or joint abnormalities on exposed skin.  SKIN: No rashes or other abnormalities noted on exposed skin.  NEURO: Alert and oriented x 3. CN 2-12 grossly intact. No focal deficits noted.       LABS:  Office Visit on 12/10/21   1. Comprehensive Metabolic Panel   Result Value Ref Range    Sodium 141 135 - 145 meq/L    Potassium 4.0 3.6 - 5.2 meq/L    Chloride 104 98 - 108 meq/L    Carbon Dioxide, Total 26 22 - 32 meq/L    Anion Gap 11 4 - 12    Glucose 85 62 - 125 mg/dL    Urea Nitrogen 19 8 - 21 mg/dL    Creatinine 8.93 9.48 - 1.18 mg/dL    Protein (Total) 7.4 6.0 - 8.2 g/dL    Albumin 4.8 3.5 - 5.2 g/dL    Bilirubin (Total) 0.7 0.2 - 1.3 mg/dL    Calcium 9.6 8.9 - 89.7 mg/dL    AST (GOT) 16 9 - 38 U/L    Alkaline Phosphatase (Total) 42 35 - 109 U/L    ALT (GPT) 14 10  - 64 U/L    eGFR by CKD-EPI 2021 >60 >59 mL/min/1.73_m2   2. CBC with Diff   Result Value Ref Range    WBC 7.53 4.3 - 10.0 10*3/uL    RBC 4.73 4.40 - 5.60 10*6/uL    Hemoglobin 14.7 13.0 - 18.0 g/dL    Hematocrit 44 61.9 - 50.0 %    MCV 93 81 - 98 fL    MCH 31.1 27.3 - 33.6 pg    MCHC 33.5 32.2 - 36.5 g/dL    Platelet Count 786 849 - 400 10*3/uL    RDW-CV 11.9 11.0 - 14.5 %    % Neutrophils 64 %    % Lymphocytes 25 %    % Monocytes 9 %    % Eosinophils 1 %    % Basophils 1 %    % Immature Granulocytes 0 %    Neutrophils 4.83 1.80 - 7.00 10*3/uL    Absolute Lymphocyte Count 1.88 1.00 - 4.80 10*3/uL    Monocytes 0.64 0.00 - 0.80 10*3/uL    Absolute Eosinophil Count 0.10 0.00 - 0.50 10*3/uL    Basophils 0.05 0.00 - 0.20 10*3/uL    Immature Granulocytes 0.03 0.00 - 0.05 10*3/uL    Nucleated RBC 0.00 0.00 10*3/uL    % Nucleated RBC 0 %     ASSESSMENT/PLAN:    1. Healthcare maintenance   Lifestyle:   - Encouraged ongoing regular aerobic exercise, as well as movement throughout the day as possible.  - Encouraged well balanced diet, with emphasize on vegetables, whole grains, lean meats in diet.   Cancer screening:  - Colon: Routine screening starting at age 74, given absence of alarm symptoms or family history.   - Prostate: Discuss risks vs benefits, starting at age 25.   Metabolic/CV:  - HTN Screen: Blood pressure at goal today an on prior visits.   - DM Screen: Not previously screened, deferred today given absence of risk factors.  - Lipid Screen: Not previously screened, deferred today given absence of risk factors.  Infectious disease:  - STI screening ordered as below.  Immunizations:  -   Patient to obtain immunization records.    2. Right inguinal pain  3. Right testicular pain  Patient with a few years of episodic right sided groin and testicular pain, as well  as urinary symptoms, onset after right inguinal hernia repair. Given onset after repair, consideration for surgical complication, such as nerve, vascular,  or cord impingement/compression.  Alternatively, could represent separate processes.  Will obtain testicular ultrasound to evaluate for abnormalities.  Will obtain UA and GC to rule out UTI or STI. If workup unrevealing, can consider re-referral to GS vs cross sectional imaging.  - Urinalysis with Reflex Culture; Future  - US  Scrotum and Contents; Future  - US  Limited To Rule Out Hernia; Future  - US  Scrotum and Contents; Future    4. Routine screening for STI   - HIV Antigen and Antibody Screen; Future  - GC and Chlam Nucleic Acid Detection; Future  - GC and Chlam Nucleic Acid Detection; Future  - Syphilis Screen; Future     Ike Schanz, MD  Internal Medical Resident, PGY3  [facility] 

## 2024-06-04 ENCOUNTER — Other Ambulatory Visit (HOSPITAL_BASED_OUTPATIENT_CLINIC_OR_DEPARTMENT_OTHER): Payer: Self-pay | Admitting: Student in an Organized Health Care Education/Training Program

## 2024-06-04 ENCOUNTER — Ambulatory Visit: Attending: Internal Medicine | Admitting: Student in an Organized Health Care Education/Training Program

## 2024-06-04 VITALS — BP 133/77 | HR 70 | Temp 97.2°F | Ht 74.0 in | Wt 171.2 lb

## 2024-06-04 DIAGNOSIS — Z113 Encounter for screening for infections with a predominantly sexual mode of transmission: Secondary | ICD-10-CM | POA: Insufficient documentation

## 2024-06-04 DIAGNOSIS — Z0000 Encounter for general adult medical examination without abnormal findings: Secondary | ICD-10-CM | POA: Insufficient documentation

## 2024-06-04 DIAGNOSIS — N50811 Right testicular pain: Secondary | ICD-10-CM | POA: Insufficient documentation

## 2024-06-04 DIAGNOSIS — R1031 Right lower quadrant pain: Secondary | ICD-10-CM | POA: Insufficient documentation

## 2024-06-04 LAB — URINALYSIS WITH REFLEX CULTURE
Bacteria, URN: NONE SEEN
Bilirubin (Qual), URN: NEGATIVE
Epith Cells_Renal/Trans,URN: NEGATIVE /HPF
Epith Cells_Squamous, URN: NEGATIVE /LPF
Glucose Qual, URN: NEGATIVE
Ketones, URN: NEGATIVE
Leukocyte Esterase, URN: NEGATIVE
Nitrite, URN: NEGATIVE
Occult Blood, URN: NEGATIVE
Protein (Alb Semiquant), URN: NEGATIVE
RBC, URN: NEGATIVE /HPF
Specific Gravity, URN: 1.027 g/mL (ref 1.006–1.027)
WBC, URN: NEGATIVE /HPF
pH, URN: 6 (ref 5.0–8.0)

## 2024-06-04 LAB — SYPHILIS SCREEN: T. pallidum IgG/IgM Ab (Bioplex): NONREACTIVE

## 2024-06-04 NOTE — Progress Notes (Signed)
 I have personally discussed the case with the resident during or immediately after the patient visit including review of history, physical exam, diagnosis, and treatment plan. I agree with the assessment and plan of care.

## 2024-06-05 LAB — HIV ANTIGEN AND ANTIBODY SCRN
HIV Antigen and Antibody Interpretation: NONREACTIVE
HIV Antigen and Antibody Result: NONREACTIVE

## 2024-06-05 LAB — GC&CHLAM NUCLEIC ACID DETECTN

## 2024-06-09 NOTE — Addendum Note (Signed)
 Addended by: BRIEN DELON PARAS on: 06/09/2024 02:49 PM     Modules accepted: Level of Service

## 2024-07-05 ENCOUNTER — Ambulatory Visit (HOSPITAL_BASED_OUTPATIENT_CLINIC_OR_DEPARTMENT_OTHER)

## 2024-07-05 ENCOUNTER — Ambulatory Visit
Admit: 2024-07-05 | Discharge: 2024-07-05 | Disposition: A | Discharge status: Home / Self Care | Attending: Internal Medicine

## 2024-07-09 ENCOUNTER — Ambulatory Visit (HOSPITAL_BASED_OUTPATIENT_CLINIC_OR_DEPARTMENT_OTHER)

## 2024-07-20 ENCOUNTER — Ambulatory Visit (HOSPITAL_BASED_OUTPATIENT_CLINIC_OR_DEPARTMENT_OTHER)

## 2024-07-20 ENCOUNTER — Ambulatory Visit

## 2024-08-17 ENCOUNTER — Encounter (HOSPITAL_COMMUNITY): Payer: Self-pay

## 2024-08-20 ENCOUNTER — Ambulatory Visit (INDEPENDENT_AMBULATORY_CARE_PROVIDER_SITE_OTHER)

## 2024-08-27 ENCOUNTER — Ambulatory Visit (HOSPITAL_BASED_OUTPATIENT_CLINIC_OR_DEPARTMENT_OTHER): Payer: Self-pay | Admitting: Family Medicine

## 2024-08-27 ENCOUNTER — Ambulatory Visit: Admission: RE | Admit: 2024-08-27 | Discharge: 2024-08-27 | Disposition: A | Discharge status: Home / Self Care

## 2024-08-27 ENCOUNTER — Ambulatory Visit (HOSPITAL_BASED_OUTPATIENT_CLINIC_OR_DEPARTMENT_OTHER)
Admit: 2024-08-27 | Discharge: 2024-08-27 | Disposition: A | Discharge status: Home / Self Care | Source: Home / Self Care

## 2024-08-27 ENCOUNTER — Other Ambulatory Visit (HOSPITAL_BASED_OUTPATIENT_CLINIC_OR_DEPARTMENT_OTHER): Payer: Self-pay | Admitting: Family Medicine

## 2024-08-27 DIAGNOSIS — K4090 Unilateral inguinal hernia, without obstruction or gangrene, not specified as recurrent: Secondary | ICD-10-CM

## 2024-08-27 DIAGNOSIS — R1031 Right lower quadrant pain: Secondary | ICD-10-CM

## 2024-08-27 DIAGNOSIS — N50811 Right testicular pain: Secondary | ICD-10-CM

## 2024-08-27 DIAGNOSIS — R1900 Intra-abdominal and pelvic swelling, mass and lump, unspecified site: Secondary | ICD-10-CM | POA: Insufficient documentation

## 2024-08-30 ENCOUNTER — Encounter (INDEPENDENT_AMBULATORY_CARE_PROVIDER_SITE_OTHER): Payer: Self-pay

## 2024-08-30 ENCOUNTER — Encounter (INDEPENDENT_AMBULATORY_CARE_PROVIDER_SITE_OTHER): Payer: Self-pay | Admitting: Surgery

## 2024-08-31 ENCOUNTER — Ambulatory Visit (INDEPENDENT_AMBULATORY_CARE_PROVIDER_SITE_OTHER): Admitting: Surgery

## 2024-08-31 VITALS — BP 114/66 | HR 66 | Temp 98.2°F | Ht 75.0 in | Wt 170.9 lb

## 2024-08-31 DIAGNOSIS — R1031 Right lower quadrant pain: Secondary | ICD-10-CM

## 2024-08-31 NOTE — Progress Notes (Signed)
 [facility]     Return Appointment History and Physical Exam    Eric Walls is a 27 year old male who presents in follow-up for some right groin discomfort/lower abdominal quadrant pain.    He underwent RA lap repair with me in 2022 and did well from that operation but returned about a year ago with some new right lower quadrant discomfort.    From my last appointment with the patient:  The plan was for CT A/P and return appt with me; if no hernia, then sports medicine    Since the patient's most recent appointment, he has obtained his CT scan just recently, then underwent an US  that demonstrated a small recurrent direct inguinal hernia 0.9 cm.     The CT scan did not demonstrate any abnormalities.    On discussion with him today, he reports that he has been working with a PT who has suggested that possibly he has some hip impingement.     He has not worked with a PT for his groin/hip issues.    He does not note any bulge in his groin.      I personally reviewed and confirmed the ROS, past medical history, past surgical history, family history, and social history in the record with the patient today.    ALLERGIES:  Patient has no known allergies.    CURRENT MEDICATIONS:  Current Medications[1]    Medical History[2]  @PROBLEMLIST @    Surgical History[3]    Family History       No data available            SOCIAL HISTORY:  Social History     Socioeconomic History    Marital status: Single     Spouse name: Not on file    Number of children: Not on file    Years of education: Not on file    Highest education level: Not on file   Occupational History    Not on file   Tobacco Use    Smoking status: Some Days     Types: Cigarettes     Passive exposure: Current    Smokeless tobacco: Current     Types: Chew   Substance and Sexual Activity    Alcohol use: Yes     Alcohol/week: 7.0 standard drinks of alcohol     Types: 5 Cans of beer, 2 Shots of liquor per week    Drug use:  Yes     Frequency: 7.0 times per week     Types: Marijuana, Nicotine    Sexual activity: Yes     Partners: Female     Birth control/protection: Condom   Other Topics Concern    Not on file   Social History Narrative    Not on file     Social Drivers of Health     Food Insecurity: Unknown (06/04/2024)    Hunger Vital Sign     Worried About Running Out of Food in the Last Year: Never true     Ran Out of Food in the Last Year: Not on file   Transportation Needs: Unknown (06/04/2024)    PRAPARE - Therapist, Art (Medical): No     Lack of Transportation (Non-Medical): Not on file   Intimate Partner Violence: Unknown (06/04/2024)    Humiliation, Afraid, Rape, and Kick questionnaire     Fear of Current or Ex-Partner: No     Emotionally  Abused: Not on file     Physically Abused: Not on file     Sexually Abused: Not on file   Housing Stability: Unknown (06/04/2024)    Housing Stability Vital Sign     Unable to Pay for Housing in the Last Year: Not on file     Number of Times Moved in the Last Year: Not on file     Homeless in the Last Year: No        EXAMINATION:  Vitals:    08/31/24 1526   BP: 114/66   BP Cuff Size: Large   BP Site: Right Arm   BP Position: Sitting   Pulse: 66   Temp: 36.8 C   TempSrc: Temporal   SpO2: 99%   Weight: 77.5 kg (170 lb 14.4 oz)   Height: 6' 3 (1.905 m)     Body mass index is 21.36 kg/m.    PHYSICAL EXAMINATION  Awake, alert, comfortable  Heart Regular  Breathing Unlabored  Abdomen: No hernias  Right Groin: No hernia  Left Groin: No hernia  No edema      IMPRESSION:  [name] is a 28 year old man with no evidence of a recurrent or new hernia but some ongoing right hip/groin discomfort that is likely more MSK in etiology and likely/possibly hip related.    We had a nice discussion and he is reassured by the fact he does not have a hernia.    He is interested in sports medicine referral and I've placed that for him today.    PLAN:  Sports medicine    Follow-up as  needed with me        Lamar B. Barbarann, MD, [facility]  Clinical Associate Professor  [facility]  [place], [place]    Phone: [phone]  Fax: [phone]    [email or web address]         [1]   Current Outpatient Medications   Medication Sig Dispense Refill    loratadine 10 MG tablet Take 1 tablet (10 mg) by mouth.       No current facility-administered medications for this visit.   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

## 2024-09-08 ENCOUNTER — Telehealth (HOSPITAL_BASED_OUTPATIENT_CLINIC_OR_DEPARTMENT_OTHER): Payer: Self-pay

## 2024-09-08 NOTE — Telephone Encounter (Signed)
 Pt scheduled

## 2024-09-08 NOTE — Telephone Encounter (Signed)
 RETURN CALL: Voicemail - Detailed Message      SUBJECT:  Appointment Request     REASON FOR VISIT: 28 year old year old male with right groin/abdominal wall discomfort, no hernia, likely hip related pathology   PREFERRED DATE/TIME: Monday or Friday are best    REASON UNABLE TO APPOINT: referral notes clinic to schedule    Attempted to warm transfer per service manual.  Staff were busy helping other patients.    Please call to discuss further

## 2024-09-12 ENCOUNTER — Encounter (HOSPITAL_BASED_OUTPATIENT_CLINIC_OR_DEPARTMENT_OTHER): Payer: Self-pay | Admitting: Student in an Organized Health Care Education/Training Program

## 2024-09-28 ENCOUNTER — Ambulatory Visit: Attending: Family Medicine | Admitting: Family Medicine

## 2024-09-28 ENCOUNTER — Encounter (HOSPITAL_BASED_OUTPATIENT_CLINIC_OR_DEPARTMENT_OTHER): Payer: Self-pay | Admitting: Family Medicine

## 2024-09-28 VITALS — BP 115/77 | HR 69 | Temp 98.3°F | Ht 74.02 in | Wt 167.5 lb

## 2024-09-28 DIAGNOSIS — M25551 Pain in right hip: Secondary | ICD-10-CM | POA: Insufficient documentation

## 2024-09-28 DIAGNOSIS — G8929 Other chronic pain: Secondary | ICD-10-CM | POA: Insufficient documentation

## 2024-09-28 DIAGNOSIS — R1031 Right lower quadrant pain: Secondary | ICD-10-CM | POA: Insufficient documentation

## 2024-09-28 NOTE — Progress Notes (Signed)
 Subjective:  28 year old male complaining of chronic right anterior hip pain.  It started sometime in 2022.  Initially he had an evaluation for hernia which was identified and then he had a repair but his symptoms did not get any better.  It started with duck hunting, walking and swabs with waders.  Gradually pain would increase.  He also does a lot of driving for work, with prolonged sitting especially driving it can become more symptomatic and also still with walking.  He has been working on rehabilitation, doing some exercises and stretching which helped a little bit but the symptoms have not abated.  He was referred by the hernia service for further evaluation considering hip pathology.    Patient Active Problem List   Diagnosis    Bilateral inguinal hernia without obstruction or gangrene    ADD (attention deficit disorder)    Frequent sinus infections       Outpatient Medications Prior to Visit   Medication Sig Dispense Refill    loratadine 10 MG tablet Take 1 tablet (10 mg) by mouth.       No facility-administered medications prior to visit.       Documentation and results review imaging up to this point which included at the end of October ultrasound to evaluate for inguinal hernia that demonstrated a small right indirect inguinal hernia partially reducible.  He had a normal ultrasound of scrotum and contents and then a CT of his abdomen and pelvis demonstrating no abnormality.  Reviewing the CT, he does have some asymmetry, convex appearance of the superior femoral head potentially consistent with cam impingement.  No significant arthritis    Objective:  BP 115/77   Pulse 69   Temp 36.8 C (Temporal)   Ht 6' 2.02 (1.88 m)   Wt 76 kg (167 lb 8.8 oz)   SpO2 99%   BMI 21.50 kg/m     Patient exhibits no acute distress.  They are alert and oriented x3.  Respiratory is normal with nonlabored breathing.  Mood is bright and affect correlates.  Speech is clear.    H in a seated position he has about 60  degrees of internal rotation of both hips with inguinal pain reproducing symptoms and internal rotation on the right, he also has pain going back to a neutral positionop test the right leg is asymptomatic.  From there, external rotation at about 30 to 40 degrees without pain.  Isometric hip strength testing is not painful    In a supine position full hip flexion and internal rotation on the right is a little stiff by about 10 degrees compared to the left and also reproduces symptoms.  He does not have tenderness over his psoas tendon and no snapping with provocative maneuvers.        Assessment    chronic right anterior hip pain clinically suspicious for Labral tear, potentially cam impingement contributing    Plan:    We discussed this at length.  He was interested in trying some physical therapy here so the referral was placed.  He was referred for a 3T MRI to evaluate for a labral tear/impingement.  He will follow-up after the study.  We discussed that if the MRI is consistent with a suspected diagnosis depending on progress at that point we might try a diagnostic/therapeutic injection and then if still not making progress, consult for hip arthroscopy

## 2024-10-04 ENCOUNTER — Encounter: Admitting: Rehabilitative and Restorative Service Providers"

## 2024-10-05 ENCOUNTER — Encounter (HOSPITAL_BASED_OUTPATIENT_CLINIC_OR_DEPARTMENT_OTHER): Payer: Self-pay | Admitting: Rehabilitative and Restorative Service Providers"

## 2024-10-08 ENCOUNTER — Encounter (INDEPENDENT_AMBULATORY_CARE_PROVIDER_SITE_OTHER): Payer: Self-pay

## 2024-10-08 ENCOUNTER — Ambulatory Visit (INDEPENDENT_AMBULATORY_CARE_PROVIDER_SITE_OTHER)

## 2024-10-08 VITALS — BP 121/75 | HR 59 | Temp 97.2°F | Ht 74.0 in | Wt 170.0 lb

## 2024-10-08 DIAGNOSIS — Z1159 Encounter for screening for other viral diseases: Secondary | ICD-10-CM

## 2024-10-08 DIAGNOSIS — Z0000 Encounter for general adult medical examination without abnormal findings: Secondary | ICD-10-CM

## 2024-10-08 DIAGNOSIS — Z23 Encounter for immunization: Secondary | ICD-10-CM

## 2024-10-08 LAB — CBC, DIFF
% Basophils: 0 %
% Eosinophils: 0 %
% Immature Granulocytes: 0 %
% Lymphocytes: 20 %
% Monocytes: 10 %
% Neutrophils: 70 %
% Nucleated RBC: 0 %
Absolute Eosinophil Count: 0 10*3/uL (ref 0.00–0.50)
Absolute Lymphocyte Count: 1.54 10*3/uL (ref 1.00–4.80)
Basophils: 0.03 10*3/uL (ref 0.00–0.20)
Hematocrit: 45 % (ref 38.0–50.0)
Hemoglobin: 15.5 g/dL (ref 13.0–18.0)
Immature Granulocytes: 0.02 10*3/uL (ref 0.00–0.05)
MCH: 31.2 pg (ref 27.3–33.6)
MCHC: 34.5 g/dL (ref 32.2–36.5)
MCV: 90 fL (ref 81–98)
Monocytes: 0.73 10*3/uL (ref 0.00–0.80)
Neutrophils: 5.22 10*3/uL (ref 1.80–7.00)
Nucleated RBC: 0 10*3/uL
Platelet Count: 216 10*3/uL (ref 150–400)
RBC: 4.97 10*6/uL (ref 4.40–5.60)
RDW-CV: 11.9 % (ref 11.0–14.5)
WBC: 7.54 10*3/uL (ref 4.3–10.0)

## 2024-10-08 LAB — COMPREHENSIVE METABOLIC PANEL
ALT (GPT): 13 U/L (ref 10–64)
AST (GOT): 13 U/L (ref 9–38)
Albumin: 4.8 g/dL (ref 3.5–5.2)
Alkaline Phosphatase (Total): 55 U/L (ref 35–109)
Anion Gap: 7 (ref 4–12)
Bilirubin (Total): 0.4 mg/dL (ref 0.2–1.3)
Calcium: 9.6 mg/dL (ref 8.9–10.2)
Carbon Dioxide, Total: 29 meq/L (ref 22–32)
Chloride: 104 meq/L (ref 98–108)
Creatinine: 1.03 mg/dL (ref 0.51–1.18)
Glucose: 98 mg/dL (ref 62–125)
Potassium: 4.6 meq/L (ref 3.6–5.2)
Protein (Total): 7 g/dL (ref 6.0–8.2)
Sodium: 140 meq/L (ref 135–145)
Urea Nitrogen: 13 mg/dL (ref 8–21)
eGFR by CKD-EPI 2021: >60 mL/min/1.73_m2 (ref >59)

## 2024-10-08 MED ORDER — HEPATITIS A-HEP B RECOMB VAC 720-20 ELU-MCG/ML IM SUSY
1.0000 mL | PREFILLED_SYRINGE | Freq: Once | INTRAMUSCULAR | Status: AC
  Administered 2024-10-08: 1 mL via INTRAMUSCULAR

## 2024-10-08 MED ORDER — TETANUS-DIPHTH-ACELL PERTUSSIS 5-2.5-18.5 LF-MCG/0.5 IM SUSP WRAPPER
0.5000 mL | Freq: Once | INTRAMUSCULAR | Status: AC
  Administered 2024-10-08: 0.5 mL via INTRAMUSCULAR

## 2024-10-08 NOTE — Progress Notes (Signed)
 Use of Ambient Listening:   Was ambient listening technology used during this visit and was verbal consent for recording obtained? No, not used. Not discussed this visit.      Chief Complaint   Patient presents with    Wellness    Follow-Up      On ultrasound for hernia       Subjective:  Eric Walls is a 28 year old male who presents on 10/08/24.    Adhd: used to be rx, stopped in college. Has coping mechanisms.   Hernia: no issues  Hip: undergoing work up with sports med  History of Present Illness    LIFESTYLE  Current dietary habits: well balanced  Supplements: na  Current exercise habits: yes, engages in regular exercise    Social Determinants of Health:  Social Determininants of Health affecting medical care: na    Intimate Partner Violence Screening:  History of sexual or physical abuse: No  History of any other forms of abuse (e.g. verbal, financial): No  Has the patient been hit, kicked, punched, or otherwise hurt by someone within the past year?: No    CANCER SCREENING  Family history of colon cancer: NO  Family history of prostate cancer: NO  Family history of breast cancer: NO      Review Of Systems  Reviewed, pertinent ROS reflected in HPI    I have reviewed and updated the problem list, medical history, family history, social history, medications, allergies in the electronic medical record.    Objective:    Vitals   Reviewed  Objective   Blood pressure 121/75, pulse (!) 59, temperature 36.2 C, temperature source Temporal, height 6' 2 (1.88 m), weight 77.1 kg (170 lb), SpO2 100%.      Physical Exam  Vitals reviewed.   Constitutional:       General: He is not in acute distress.     Appearance: Normal appearance. He is not toxic-appearing.   HENT:      Head: Normocephalic and atraumatic.      Right Ear: Tympanic membrane normal. There is no impacted cerumen.      Left Ear: Tympanic membrane normal. There is no impacted cerumen.   Eyes:      General: No scleral icterus.     Pupils: Pupils are equal, round, and  reactive to light.   Cardiovascular:      Rate and Rhythm: Normal rate and regular rhythm.      Heart sounds: Normal heart sounds. No murmur heard.  Pulmonary:      Effort: Pulmonary effort is normal. No respiratory distress.      Breath sounds: Normal breath sounds.   Abdominal:      General: Abdomen is flat. There is no distension.      Palpations: Abdomen is soft.      Tenderness: There is no abdominal tenderness.   Musculoskeletal:      Right lower leg: No edema.      Left lower leg: No edema.   Skin:     General: Skin is warm and dry.      Findings: No lesion or rash.   Neurological:      Mental Status: He is alert. Mental status is at baseline.   Psychiatric:         Mood and Affect: Mood normal.         Behavior: Behavior normal.           Physical Exam  Assessment & Plan:       Assessment & Plan       (Z00.00) Wellness examination  (primary encounter diagnosis)  Plan: CBC with Diff, Comprehensive Metabolic Panel        Discussed diet and exercise, obtaining vaccines, will upload immunization record to chart, will rtc for travel consult as he is plotting a bike trip across [place] in 2026    (Z11.59) Need for hepatitis C screening test  Plan: Hepatitis C Antibody w/Reflex PCR                Patient instructed to return to clinic with any new, worsening, or unresolved concerns.

## 2024-10-08 NOTE — Progress Notes (Signed)
 Preventative Health Care Updates   Since your last visit, please tell us  if you have had any of the below services outside of [facility]:                                                                            Cervical screening/PAP:N/A      Mammo: N/A      Colon Screen: NO   If yes, location/ date.    Specialty Care Updates  Have seen a specialist since your last visit: NO   If yes, Name, location and date.    Any forms to complete today? NO     Health Maintenance   Topic Date Due    Hepatitis C Screening  Never done    DTaP, Tdap and Td Vaccines (1 - Tdap) Never done    Hepatitis B Vaccine (1 of 3 - 19+ 3-dose series) Never done    Pneumococcal Vaccine: Pediatrics (0-5 years) and At-Risk Patients (6-49 years) (1 of 2 - PCV) Never done    HPV Vaccine (1 - 3-dose SCDM series) Never done    COVID-19 Vaccine (1 - 2025-26 season) Never done    Influenza Vaccine (1) Never done    Depression Screening (PHQ-2)  06/04/2025    HIV Screening  Completed    Hepatitis A Vaccine  Aged Out    Meningococcal B Vaccine  Aged Out     Injection given today without initial adverse effect. YES    Jazon Jipson, CMA Vaccine Screening Questions    Interpreter: No    1. Are you allergic to Latex? NO    2.  Have you had a serious reaction or an allergic reaction to a vaccine?  NO    3.  Currently have a moderate or severe illness, including fever?  NO    4.  Ever had a seizure or any neurological problem associated with a vaccine? (DTaP/TDaP/DTP pertinent) NO    5.  Is patient receiving any live vaccinations today? (Varicella-Chickenpox, MMR-Measles/Mumps/Rubella, Zoster-Shingles, Flumist, Yellow Fever) NOTE: oral rotavirus is exempt  NO    If YES to any of the questions above - Do NOT give vaccine.  Consult with RN or provider in clinic.  (#5 can be YES if all Live vaccine questions are answered NO)    If NO to all questions above - Patient may receive vaccine.    6. Are you pregnant or is there a chance you could become pregnant  during the next month (HPV pertinent) NO    7. Do you need to receive the Flu vaccine today? NO      All patients are encouraged to wait 15 minutes before leaving after receiving any vaccine.    VIS given 10/08/2024 by Duwaine LITTIE Conine, CMA.      Future Appointments   Date Time Provider Department Center   10/14/2024  4:15 PM [id number]

## 2024-10-08 NOTE — Patient Instructions (Addendum)
 Schedule eye clinic!   Welcome, Eric Walls, and thank you for choosing me as your primary care doctor! I'm committed to providing you with personalized and comprehensive care.    Preventive care is the cornerstone of my practice. I expect all patients to schedule an in-person visit at least once a year for preventive care and lab work. This allows us  to monitor your health, address concerns, and maintain the highest standard of care. I prefer in person appointments for multiple concerns rather than telehealth. Long term medication refills will not be provided unless seen in person at least once a year. Additionally, prescriptions for controlled substances require follow-up visits a minimum of every three months.     For non-urgent questions sent via MyChart, responses typically take 3-5 business days. If your query requires more consideration and time, the clinical support staff may recommend scheduling a telehealth visit. Your insurance may be billed for time spent on mychart care coordination. For urgent symptoms, please contact our triage line or seek emergency care.    To continue your care:  1. Schedule your follow up appointments promptly either at the front desk after your visit or through our phone/online booking system.  2. Bring any forms you need filled out with you to your appointments or send ahead via mychart.   3. Come prepared with any questions or concerns.  4. If you have concerns or need help scheduling an appointment start by calling our clinic directly at [phone].     If you have any questions, feel free to contact us . I look forward to partnering with you for your health.    Warm regards,  Vernell Head, MD

## 2024-10-09 LAB — HEPATITIS C AB WITH REFLEX PCR: Hepatitis C Antibody w/Rflx PCR: NONREACTIVE

## 2024-10-11 ENCOUNTER — Ambulatory Visit (INDEPENDENT_AMBULATORY_CARE_PROVIDER_SITE_OTHER): Payer: Self-pay

## 2024-10-14 ENCOUNTER — Ambulatory Visit: Admission: RE | Admit: 2024-10-14 | Discharge: 2024-10-14 | Disposition: A | Discharge status: Home / Self Care

## 2024-10-14 DIAGNOSIS — G8929 Other chronic pain: Secondary | ICD-10-CM | POA: Insufficient documentation

## 2024-10-14 DIAGNOSIS — M25551 Pain in right hip: Secondary | ICD-10-CM | POA: Insufficient documentation

## 2024-10-15 ENCOUNTER — Encounter (HOSPITAL_BASED_OUTPATIENT_CLINIC_OR_DEPARTMENT_OTHER): Payer: Self-pay | Admitting: Family Medicine

## 2024-11-01 ENCOUNTER — Telehealth (HOSPITAL_BASED_OUTPATIENT_CLINIC_OR_DEPARTMENT_OTHER): Payer: Self-pay

## 2024-11-01 NOTE — Telephone Encounter (Signed)
 RETURN CALL: Voicemail - Detailed Message      SUBJECT:  Appointment Request     REASON FOR VISIT: PT  PREFERRED DATE/TIME: Monday or Friday best  Also flexible  REASON UNABLE TO APPOINT: per SM    Attempted to warm transfer per service manual.  Staff were busy helping other patients.    Please call to discuss further

## 2024-11-04 MED ORDER — HEPATITIS A-HEP B RECOMB VAC 720-20 ELU-MCG/ML IM SUSY
1.0000 mL | PREFILLED_SYRINGE | Freq: Once | INTRAMUSCULAR | Status: AC
  Administered 2024-11-08: 1 mL via INTRAMUSCULAR

## 2024-11-05 ENCOUNTER — Ambulatory Visit: Attending: Family Medicine

## 2024-11-05 DIAGNOSIS — S73191D Other sprain of right hip, subsequent encounter: Secondary | ICD-10-CM | POA: Insufficient documentation

## 2024-11-05 DIAGNOSIS — M25551 Pain in right hip: Secondary | ICD-10-CM | POA: Insufficient documentation

## 2024-11-05 DIAGNOSIS — G8929 Other chronic pain: Secondary | ICD-10-CM | POA: Insufficient documentation

## 2024-11-05 NOTE — Progress Notes (Signed)
 This note serves as a [facility] [employer] form that requires the referring provider to certify the need for therapy services furnished under this Plan of Treatment. The signing provider is certifying the plan of care.    [facility] PH[facility]ICAL THERAP[facility] INITIAL EVALUATION - HIP      Diagnosis: (S73.191D) Acetabular labrum tear, right, subsequent encounter  (primary encounter diagnosis)  Referring Provider: Gertie Norleen Fallow, MD  Referring Provider NPI: [id number]    VISIT COUNT:   1    Time In: 1230  Time Out: 1:15  Total Time: 45    EVALUATION:    SUBJECTIVE:   Eric Walls is a well appearing 29 YO presenting in NAD.  Primary complaint is ongoing R hip pain that began 2-3 years ago.  Began when he was walking a lot while pheasant hunting.  Hip pain has progressively worsened the past few years.  It's not prohibitive with activities, as he usually has pain after activities.  Recently did hiking and noticed pain on consecutive days of hiking.  Pain is inconsistent, stating moderate pain at worst.  Symptoms made worse with prolonged sitting (>30 minutes), riding dirt bike, squatting, running, and exercising on the elliptical.  Typically R groin pain with occasional posterolateral pain in R hip.  Current physical activity level is lifting at the gym a few days per week (body weight exercises and less machines).  Goal is to manage pain and return to long distance walking, hiking, backpacking, and being able to ride dirt bike comfortably.        PMHx:  has a past medical history of Disorder of muscle, ligament, and fascia, Headache, Tooth disorder, and Ventral hernia.  PSHx:  has a past surgical history that includes pr oral surgery procedure and Hernia Repair.  Meds:   No outpatient medications have been marked as taking for the 11/05/24 encounter (Rehab Therapy) with Phyllistine Domingos William, PT.     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      OBJECTIVE:   Squat - FP in R hip  Lunge - FN   SL Squat - DN, with poor dynamic control at knee   MS Flexion - FN   MS Extension - FN  MS Rotation - DN, limited L rotation   LEFS -       DATE TAKEN: 11/05/2024  ROM      PROM      HIP Right Pain +/- Left Pain +/-  Comments   Flexion 125* +        Extension 30*        Abduction 55*        Adduction         IR @ 90 30* +       ER @ 90 55*            DATE TAKEN: 11/05/2024  Strength HHD  Hip Right Pain +/- Left Pain +/-  Comments   Flexion 38 + 43     Extension 55  45     Abduction 23 + 29     Adduction  IR 40  36     ER 27 +  34       Hip Special Tests  Special Tests Right Left Comments   FABER +     FADDIR +     FADER Resisted +     GT Palpation -     Hip ABD Test -     SL Stance  -     Stinchfield +     Log Roll -     Long Axis Distraction Mild relief            SIJ compression - -    SIJ Distraction - -    Thigh Thrust - -    Sacral Thrust - -      IMAGING:  MRI 10/14/24 -  IMPRESSION  1.  Right superior chondrolabral separation at 12:00 and trace surface fraying at the anterior-superior labrum.  2.  Focal marrow edema along the right sacroiliac joint could represent sacroiliitis or early osteoarthritis.      Fall Risk: The patient has not fallen in the past year.    TREATMENT:        Manual Therapy 11/05/2024               Total Time      Therapeutic Exercise 11/05/2024            HEP review    Total time 25'     HEP:   Access Code: [email or web address]  URL: [email or web address]  Date: 11/05/2024  Prepared by: Celestine Goltz    Exercises  - Quadruped Fire Hydrants  - 1 x daily - 3-5 x weekly - 3 sets - 10-15 reps  - Quadruped Kickbacks  - 1 x daily - 3-5 x weekly - 3 sets - 10-15 reps  - Sidelying  Lateral Leg Raise with Resistance at Knees  - 1 x daily - 3-5 x weekly - 3 sets - 10-15 reps  - Runners March  - 1 x daily - 3-5 x weekly - 3 sets - 10-15 reps  - Lateral Walks with Band at Feet  - 1 x daily - 3-5 x weekly - 1 sets - 5 hold  - Forward Lunge  - 1 x daily - 3-5 x weekly - 3 sets - 6-10 reps  - Single Leg Heel Raise on Step  - 1 x daily - 3-5 x weekly - 3 sets - 15 reps    ASSESSMENT:   Eric Walls presents to PT with functional limitations with prolonged sitting, running, and squatting secondary to ongoing R hip pain..  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  Eric Walls responded well to manual therapy and exercise and should benefit greatly from PT moving forward.  The patients rehabilitation potential is: Excellent      PLAN:   Patient will attend PT at a frequency of weekly to bi weekly for 4-6 weeks and then biweekly for 8 weeks.     PATIENT GOALS:   These goals were discussed and the patient agrees with them.    Goal Type Description  Timeframe Status   Short-Term Patient will have hip ROM WFL in order to perform ADL's 01/03/25 []  Not Started []  In Progress []  Met   Long-Term Hip HHD testing >90% symmetry in order to return to PLOF.   02/03/25 []  Not Started []  In Progress []  Met   Long-Term Able to hike 5-10 miles with minimal symptoms in order to return to PLOF  02/03/25 []  Not Started []  In Progress []  Met           PLAN OF CARE:   Planned Interventions: Progressive loading program aimed at improving tissue tolerance to movement/load and increasing physical activity. Interventions may include patient/family education, therapeutic exercise, therapeutic activities, neuromuscular re-education, manual therapy, and modalities as needed.    This patient will be seen by a physical therapist or a physical therapist assistant following this plan of care.        Thank you for this referral.            Celestine Elsie Goltz, PT    Please electronically sign in Epic or return fax to:  [facility]  [address] [place], [place] [address]  Telephone  [phone]  Fax  [phone]    I concur with the need for ongoing rehabilitation services for Eric Walls as described above.    Referring Provider Signature: _________________________   Date: _____________    Report Date: 11/05/2024

## 2024-11-08 ENCOUNTER — Ambulatory Visit (INDEPENDENT_AMBULATORY_CARE_PROVIDER_SITE_OTHER)

## 2024-11-08 DIAGNOSIS — Z23 Encounter for immunization: Secondary | ICD-10-CM

## 2024-11-08 NOTE — Progress Notes (Signed)
 Vaccine Screening Questions    Interpreter: No    1. Are you allergic to Latex? NO    2.  Have you had a serious reaction or an allergic reaction to a vaccine?  NO    3.  Currently have a moderate or severe illness, including fever?  NO    4.  Ever had a seizure or any neurological problem associated with a vaccine? (DTaP/TDaP/DTP pertinent) NO    5.  Is patient receiving any live vaccinations today? (Varicella-Chickenpox, MMR-Measles/Mumps/Rubella, Zoster-Shingles, Flumist, Yellow Fever) NOTE: oral rotavirus is exempt  NO    If YES to any of the questions above - Do NOT give vaccine.  Consult with RN or provider in clinic.  (#5 can be YES if all Live vaccine questions are answered NO)    If NO to all questions above - Patient may receive vaccine.    6. Are you pregnant or is there a chance you could become pregnant during the next month (HPV pertinent) NO    7. Do you need to receive the Flu vaccine today? NO    All patients are encouraged to wait 15 minutes before leaving after receiving any vaccine.    VIS given 11/08/2024 by Claris Fuller Bernhardt Riemenschneider, CMA.

## 2024-11-09 ENCOUNTER — Ambulatory Visit: Admitting: Family Medicine

## 2024-11-09 DIAGNOSIS — S73191D Other sprain of right hip, subsequent encounter: Secondary | ICD-10-CM

## 2024-11-09 NOTE — Progress Notes (Signed)
 Distant Site Telemedicine Encounter  I conducted this encounter via secure, live, face-to-face video conference with the patient. I reviewed the risks and benefits of telemedicine as pertinent to this visit and the patient agreed to proceed.    Provider Location: On-site location (clinic, hospital, on-site office)  Patient Location: At home    Present with patient: No one else present   Met with you regarding treatment of his hip pain.  He has some borderline cam impingement and chondral labral separation with symptoms that are primarily anterior that seem to be likely intra-articular.  Also a little bit of irritation to the sacroiliac joint.  He is not having significant posterior symptoms.  He starting to work on physical therapy.  Has had his second visit.  It is bothersome but not significantly limiting.    He had a number of questions about next steps.  At this point he is inclined to keep up the PT.  Will schedule a diagnostic injection and is interested in including cortisone in an effort to quiet this down.  Later in the year he is planning on taking a few months off and doing some traveling.  Hoping to get it under better control then.  If symptoms do persist he would be interested in considering surgery.  We discussed the indications and thoughts about long-term issues.  He will start with the injection and then follow-up based on his response to that.    I spent a total of 20  minutes for the patient's care on the date of the service.

## 2024-11-12 ENCOUNTER — Ambulatory Visit (HOSPITAL_BASED_OUTPATIENT_CLINIC_OR_DEPARTMENT_OTHER): Payer: Self-pay | Admitting: Rehabilitative and Restorative Service Providers"

## 2024-11-12 DIAGNOSIS — S73191D Other sprain of right hip, subsequent encounter: Secondary | ICD-10-CM

## 2024-11-12 NOTE — Progress Notes (Signed)
 This note serves as a [employer] form that requires the referring provider to certify the need for therapy services furnished under this Plan of Treatment. The signing provider is certifying the plan of care.     SPORTS MEDICINE PHYSICAL THERAPY DAILY NOTE - HIP      Diagnosis: (S73.191D) Acetabular labrum tear, right, subsequent encounter  (primary encounter diagnosis)  Referring Provider: Gertie Norleen Fallow, MD  Referring Provider NPI: [id number]    VISIT COUNT:   2      EVALUATION:    SUBJECTIVE:   Daily: Has been performing HEP as prescribed. Has a couple questions on technique. Hip has not been flaring up as much recently. Has backed off some activity. Notes chief pain is tenderness in front of hip on bone and sometimes radiates down into groin, especially after hiking and dirt biking.    HPI: Eric Walls is a well appearing 29 YO presenting in NAD.  Primary complaint is ongoing R hip pain that began 2-3 years ago.  Began when he was walking a lot while pheasant hunting.  Hip pain has progressively worsened the past few years.  It's not prohibitive with activities, as he usually has pain after activities.  Recently did hiking and noticed pain on consecutive days of hiking.  Pain is inconsistent, stating moderate pain at worst.  Symptoms made worse with prolonged sitting (>30 minutes), riding dirt bike, squatting, running, and exercising on the elliptical.  Typically R groin pain with occasional posterolateral pain in R hip.  Current physical activity level is lifting at the gym a few days per week (body weight exercises and less machines).  Goal is to manage pain and return to long distance walking, hiking, backpacking, and being able to ride dirt bike comfortably.        PMHx:  has a past medical history of Disorder of muscle, ligament, and fascia, Headache, Tooth disorder, and Ventral hernia.  PSHx:  has a past surgical history that includes pr oral surgery procedure and Hernia Repair.  Meds:   No outpatient  medications have been marked as taking for the 11/12/24 encounter (Rehab Therapy) with Najla Aughenbaugh, Izetta Helling, PTA.     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      OBJECTIVE:   Squat - FP in R hip  Lunge - FN   SL Squat - DN, with poor dynamic control at knee   MS Flexion - FN   MS Extension - FN  MS Rotation - DN, limited L rotation   LEFS -       DATE TAKEN: 11/05/2024  ROM      PROM      HIP Right Pain +/- Left Pain +/-  Comments   Flexion 125* +        Extension 30*        Abduction 55*        Adduction         IR @ 90 30* +       ER @ 90 55*            DATE TAKEN: 11/05/2024  Strength HHD  Hip Right Pain +/- Left Pain +/-  Comments   Flexion 38 + 43     Extension 55  45     Abduction 23 + 29     Adduction        IR 40  36  ER 27 +  34       Hip Special Tests  Special Tests Right Left Comments   FABER +     FADDIR +     FADER Resisted +     GT Palpation -     Hip ABD Test -     SL Stance  -     Stinchfield +     Log Roll -     Long Axis Distraction Mild relief            SIJ compression - -    SIJ Distraction - -    Thigh Thrust - -    Sacral Thrust - -      IMAGING:  MRI 10/14/24 -  IMPRESSION  1.  Right superior chondrolabral separation at 12:00 and trace surface fraying at the anterior-superior labrum.  2.  Focal marrow edema along the right sacroiliac joint could represent sacroiliitis or early osteoarthritis.      Fall Risk: The patient has not fallen in the past year.    TREATMENT:        Manual Therapy 11/05/2024   STM/TpR TFL/glutes/  sartorious  STM/TpR TFL/glutes/  sartorious    ROM assess       Total Time 10'     Therapeutic Exercise 11/05/2024 11/12/24     Wall constrained split squat 2x10    Wall stability w/ shoulder and march RTB 2x10     S/L hip abd - cues for technique RTB 2x10    HEP review  Quadruped fire hydrant 2x10    Quadruped hip ext 2x10    Quadruped hip ext GTB 2x10 - cues for serratus stability and trunk stability       Band walks RTB  feet 2x10   Total time 25' 35 minutes     HEP:     Access Code: [email or web address]  URL: [email or web address]  Date: 11/12/2024  Prepared by: Izetta Alter    Program Notes  Cues:band walks for steps vs holdfire hydrant w/ something on low back to avoid rotationHip up and back for side leg liftBack on wall lungeElbow/shoulder support on wall with march    Exercises  - Bird Dog with Resistance  - 1 x daily - 5 x weekly - 3 sets - 10 reps - 1 hold      Access Code: [email or web address]  URL: [email or web address]  Date: 11/05/2024  Prepared by: Celestine Goltz    Exercises  - Quadruped Fire Hydrants  - 1 x daily - 3-5 x weekly - 3 sets - 10-15 reps  - Quadruped Kickbacks  - 1 x daily - 3-5 x weekly - 3 sets - 10-15 reps  - Sidelying Lateral Leg Raise with Resistance at Knees  - 1 x daily - 3-5 x weekly - 3 sets - 10-15 reps  - Runners March  - 1 x daily - 3-5 x weekly - 3 sets - 10-15 reps  - Lateral Walks with Band at Feet  - 1 x daily - 3-5 x weekly - 1 sets - 5 hold  - Forward Lunge  - 1 x daily - 3-5 x weekly - 3 sets - 6-10 reps  - Single Leg Heel Raise on Step  - 1 x daily - 3-5 x weekly - 3 sets - 15 reps    ASSESSMENT:   HEP still appropriately challenging. Reviewed technique with cues for lumbopelvic stability.     Eric Walls presents to PT with functional limitations with prolonged  sitting, running, and squatting secondary to ongoing R hip pain..  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  Cyle responded well to manual therapy and exercise and should benefit greatly from PT moving forward.  The patients rehabilitation potential is: Excellent      PLAN:   Patient will attend PT at a frequency of weekly to bi weekly for 4-6 weeks and then biweekly for 8 weeks.     PATIENT GOALS:   These goals were discussed and the patient agrees with them.    Goal Type Description  Timeframe Status   Short-Term Patient will have hip ROM WFL in order to perform ADL's 01/03/25 []  Not Started []  In Progress []  Met   Long-Term  Hip HHD testing >90% symmetry in order to return to PLOF.   02/03/25 []  Not Started []  In Progress []  Met   Long-Term Able to hike 5-10 miles with minimal symptoms in order to return to [facility] 02/03/25 []  Not Started []  In Progress []  Met           PLAN OF CARE:   Planned Interventions: Progressive loading program aimed at improving tissue tolerance to movement/load and increasing physical activity. Interventions may include patient/family education, therapeutic exercise, therapeutic activities, neuromuscular re-education, manual therapy, and modalities as needed.    This patient will be seen by a physical therapist or a physical therapist assistant following this plan of care.        Thank you for this referral.            Izetta Alter, PTA, CSCS      Please electronically sign in Epic or return fax to:  [facility]  [address] [place], [place] [address]  Telephone  [phone]  Fax  [phone]    I concur with the need for ongoing rehabilitation services for Eric Walls as described above.    Referring Provider Signature: _________________________   Date: _____________    Report Date: 11/05/2024

## 2024-11-16 ENCOUNTER — Ambulatory Visit (HOSPITAL_BASED_OUTPATIENT_CLINIC_OR_DEPARTMENT_OTHER): Payer: Self-pay | Admitting: Rehabilitative and Restorative Service Providers"

## 2024-11-16 DIAGNOSIS — S73191D Other sprain of right hip, subsequent encounter: Secondary | ICD-10-CM

## 2024-11-16 NOTE — Progress Notes (Signed)
 This note serves as a [employer] form that requires the referring provider to certify the need for therapy services furnished under this Plan of Treatment. The signing provider is certifying the plan of care.    [name]      Diagnosis: (S73.191D) Acetabular labrum tear, right, subsequent encounter  (primary encounter diagnosis)  Referring Provider: Gertie Norleen Fallow, MD  Referring Provider NPI: [id number]    VISIT COUNT:   3    *Patient 7 min late    EVALUATION:    SUBJECTIVE:   Daily:  Has been performing HEP as prescribed and the exercises are getting easier. Has been trying to pay attention to aggravating activities. Riding dirt bike 3-4 hours and didn't have pain. Had pain with planting and pivoting to left.    HPI: Eric Walls is a well appearing 29 YO presenting in NAD.  Primary complaint is ongoing R hip pain that began 2-3 years ago.  Began when he was walking a lot while pheasant hunting.  Hip pain has progressively worsened the past few years.  It's not prohibitive with activities, as he usually has pain after activities.  Recently did hiking and noticed pain on consecutive days of hiking.  Pain is inconsistent, stating moderate pain at worst.  Symptoms made worse with prolonged sitting (>30 minutes), riding dirt bike, squatting, running, and exercising on the elliptical.  Typically R groin pain with occasional posterolateral pain in R hip.  Current physical activity level is lifting at the gym a few days per week (body weight exercises and less machines).  Goal is to manage pain and return to long distance walking, hiking, backpacking, and being able to ride dirt bike comfortably.        PMHx:  has a past medical history of Disorder of muscle, ligament, and fascia, Headache, Tooth disorder, and Ventral hernia.  PSHx:  has a past surgical history that includes pr oral surgery procedure and Hernia Repair.  Meds:   No outpatient medications have been marked as  taking for the 11/16/24 encounter (Appointment) with Eric Walls, Eric Walls, PTA.     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    TREATMENT:        Manual Therapy 11/12/2024 11/16/24   STM/TpR TFL/glutes/  sartorious  STM/TpR TFL/glutes/  sartorious STM/TpR TFL/glutes/  sartorious    ROM assess         Total Time 10' 8'     Therapeutic Exercise 11/05/2024 11/12/24 11/16/24     Wall constrained split squat 2x10    Wall stability w/ shoulder and march RTB 2x10 SL mini squat with Blue TB knees and P/L sliders 2x10 ea - cues for trunk lean    Forward trunk lean on wall march RTB 2x10     S/L hip abd - cues for technique RTB 2x10 Band walks - review    HEP review  Quadruped fire hydrant 2x10    Quadruped hip ext 2x10    Quadruped hip ext GTB 2x10 -  cues for serratus stability and trunk stability   Lunge to hip drive with CC row 10 lbs 7k89    Lunge with opposite arm BU KB hold 7.5 lbs x10     Band walks RTB feet 2x10 HEP review   Total time 25' 35 minutes 34 minutes     HEP:     Access Code: [email or web address]  URL: [email or web address]  Date: 11/16/2024  Prepared by: Eric Walls    Program Notes  Cues:band walks for steps vs holdfire hydrant w/ something on low back to avoid rotationHip up and back for side leg liftBack on wall lungeElbow/shoulder support on wall with march    Exercises  - Bird Dog with Resistance  - 1 x daily - 3-5 x weekly - 3 sets - 10-15 reps - 1 hold  - Clock reaches with band  - 1 x daily - 3-5 x weekly - 3 sets - 8-10 reps - 1 hold      Access Code: [email or web address]  URL: [email or web address]  Date: 11/05/2024  Prepared by: Eric Walls    Exercises  - Quadruped Fire Hydrants  - 1 x daily - 3-5 x weekly - 3 sets -  10-15 reps  - Quadruped Kickbacks  - 1 x daily - 3-5 x weekly - 3 sets - 10-15 reps  - Sidelying Lateral Leg Raise with Resistance at Knees  - 1 x daily - 3-5 x weekly - 3 sets - 10-15 reps  - Runners March  - 1 x daily - 3-5 x weekly - 3 sets - 10-15 reps  - Lateral Walks with Band at Feet  - 1 x daily - 3-5 x weekly - 1 sets - 5 hold  - Forward Lunge  - 1 x daily - 3-5 x weekly - 3 sets - 6-10 reps  - Single Leg Heel Raise on Step  - 1 x daily - 3-5 x weekly - 3 sets - 15 reps    ASSESSMENT:   Moderate fatigue through glutes and core with exercises today. Requires intermittent cues for lumbopelvic stability. Provided with red - blue bands to progress resistance as tolerated.     Eric Walls presents to PT with functional limitations with prolonged sitting, running, and squatting secondary to ongoing R hip pain..  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  Eric Walls responded well to manual therapy and exercise and should benefit greatly from PT moving forward.  The patients rehabilitation potential is: Excellent      PLAN:   Patient will attend PT at a frequency of weekly to bi weekly for 4-6 weeks and then biweekly for 8 weeks.     PATIENT GOALS:   These goals were discussed and the patient agrees with them.    Goal Type Description  Timeframe Status   Short-Term Patient will have hip ROM WFL in order to perform ADL's 01/03/25 []  Not Started []  In Progress []  Met   Long-Term Hip HHD testing >90% symmetry in order to return to PLOF.   02/03/25 []  Not Started []  In Progress []  Met   Long-Term Able to hike 5-10 miles with minimal symptoms in order to return to [facility] 02/03/25 []  Not Started []  In Progress []  Met           PLAN OF CARE:   Planned Interventions: Progressive loading program aimed at improving tissue tolerance to movement/load and increasing physical activity. Interventions may include patient/family education, therapeutic exercise, therapeutic activities, neuromuscular re-education, manual therapy, and  modalities as needed.    This patient will be seen by a physical therapist or a physical therapist assistant following this plan of care.        Thank you for this referral.            Eric Walls, PTA, CSCS      Please electronically sign in Epic or return fax to:  [facility]  [address] [place], [place] [address]  Telephone  [phone]  Fax  [phone]    I concur with the need for ongoing rehabilitation services for Eric Walls as described above.    Referring Provider Signature: _________________________   Date: _____________    Report Date: 11/05/2024

## 2024-11-17 NOTE — Progress Notes (Signed)
 This note serves as a [employer] form that requires the referring provider to certify the need for therapy services furnished under this Plan of Treatment. The signing provider is certifying the plan of care.    [place] SPORTS MEDICINE PHYSICAL THERAPY DAILY NOTE - HIP      Diagnosis: (S73.191D) Acetabular labrum tear, right, subsequent encounter  (primary encounter diagnosis)  Referring Provider: Gertie Norleen Fallow, MD  Referring Provider NPI: [id number]    VISIT COUNT:   4    Time In: 0935  Time Out: 1018  Total Time: 43    SUBJECTIVE:   Eric Walls reports HEP is going well and he is performing it consistently and progressing to tougher bands.  Will have pain at times with HEP, but no major flare ups.  Rode dirt bikes 2 weeks ago and he was a bit sore but not as intense as previously when he rode.        HPI: Eric Walls is a well appearing 29 YO presenting in NAD.  Primary complaint is ongoing R hip pain that began 2-3 years ago.  Began when he was walking a lot while pheasant hunting.  Hip pain has progressively worsened the past few years.  It's not prohibitive with activities, as he usually has pain after activities.  Recently did hiking and noticed pain on consecutive days of hiking.  Pain is inconsistent, stating moderate pain at worst.  Symptoms made worse with prolonged sitting (>30 minutes), riding dirt bike, squatting, running, and exercising on the elliptical.  Typically R groin pain with occasional posterolateral pain in R hip.  Current physical activity level is lifting at the gym a few days per week (body weight exercises and less machines).  Goal is to manage pain and return to long distance walking, hiking, backpacking, and being able to ride dirt bike comfortably.        OBJECTIVE:   Squat - FP in R hip  Lunge - FN   SL Squat - DN, with poor dynamic control at knee   MS Flexion - FN   MS Extension - FN  MS Rotation - DN, limited L rotation     Fall Risk: The patient has not fallen in the past  year.    TREATMENT:        Manual Therapy 11/12/2024 11/16/24 1/28   STM/TpR TFL/glutes/  sartorious  STM/TpR TFL/glutes/  sartorious STM/TpR TFL/glutes/  sartorious STM R gluteus med/max, piriformis    ROM assess  Long axis hip traction R      R hip EXT str   Total Time 10' 8' 15'     Therapeutic Exercise 11/05/2024 11/12/24 11/16/24 1/28     Wall constrained split squat 2x10    Wall stability w/ shoulder and march RTB 2x10 SL mini squat with Blue TB knees and P/L sliders 2x10 ea - cues for trunk lean    Forward trunk lean on wall march RTB 2x10 SL Bridge w/RTB 2x10      S/L hip abd - cues for technique RTB 2x10 Band walks - review 1/2 Kneel Lat Lunge 20# x10ea     HEP review  Quadruped fire hydrant 2x10    Quadruped hip ext 2x10    Quadruped hip ext GTB 2x10 - cues for serratus stability and trunk stability   Lunge to hip drive with CC row 10 lbs 7k89    Lunge with opposite arm BU KB hold 7.5 lbs x10 Standing KB March on Box 5# x15  Lateral Walks w/small HR GTB 3 laps      Band walks RTB feet 2x10 HEP review    Total time 25' 35 minutes 34 minutes 28'     HEP:     Access Code: [email or web address]  URL: [email or web address]  Date: 11/16/2024  Prepared by: Izetta Alter    Program Notes  Cues:band walks for steps vs holdfire hydrant w/ something on low back to avoid rotationHip up and back for side leg liftBack on wall lungeElbow/shoulder support on wall with march    Exercises  - Bird Dog with Resistance  - 1 x daily - 3-5 x weekly - 3 sets - 10-15 reps - 1 hold  - Clock reaches with band  - 1 x daily - 3-5 x weekly - 3 sets - 8-10 reps - 1 hold      Access Code: [email or web address]  URL: [email or web address]  Date: 11/24/2024  Prepared by: Celestine Goltz    Exercises  - Quadruped Fire Hydrants  - 1 x daily - 3-5 x weekly - 3 sets - 10-15 reps  - Quadruped Kickbacks  - 1 x daily - 3-5 x weekly - 3 sets - 10-15 reps  - Sidelying Lateral Leg Raise with Resistance at Knees  - 1 x daily - 3-5 x weekly - 3 sets - 10-15 reps  -  Runners March  - 1 x daily - 3-5 x weekly - 3 sets - 10-15 reps  - Lateral Walks with Band at Feet  - 1 x daily - 3-5 x weekly - 1 sets - 5 hold  - Forward Lunge  - 1 x daily - 3-5 x weekly - 3 sets - 6-10 reps  - Single Leg Heel Raise on Step  - 1 x daily - 3-5 x weekly - 3 sets - 15 reps  - Bird Dog with Resistance  - 1 x daily - 3-5 x weekly - 3 sets - 10 reps  - Standing 3-Way Leg Reach with Resistance at Ankles and Counter Support  - 1 x daily - 3-5 x weekly - 3 sets - 10 reps    ASSESSMENT:   Good relief with long axis hip traction.  Progressed loading on hip flexor and abductors with appropriate levels of fatigue present.  Combine HEP's per Jylan's request and he expressed understanding of performance.          PLAN:   Patient will attend PT at a frequency of weekly to bi weekly for 4-6 weeks and then biweekly for 8 weeks.     PATIENT GOALS:   These goals were discussed and the patient agrees with them.    Goal Type Description  Timeframe Status   Short-Term Patient will have hip ROM WFL in order to perform ADL's 01/03/25 []  Not Started []  In Progress []  Met   Long-Term Hip HHD testing >90% symmetry in order to return to PLOF.   02/03/25 []  Not Started []  In Progress []  Met   Long-Term Able to hike 5-10 miles with minimal symptoms in order to return to [facility] 02/03/25 []  Not Started []  In Progress []  Met         PLAN OF CARE:   Planned Interventions: Progressive loading program aimed at improving tissue tolerance to movement/load and increasing physical activity. Interventions may include patient/family education, therapeutic exercise, therapeutic activities, neuromuscular re-education, manual therapy, and modalities as needed.    This patient will be seen by a physical therapist or a physical therapist assistant following this  plan of care.      Thank you for this referral.            Natesha Hassey, PT, DPT

## 2024-11-24 ENCOUNTER — Ambulatory Visit (HOSPITAL_BASED_OUTPATIENT_CLINIC_OR_DEPARTMENT_OTHER)

## 2024-11-24 DIAGNOSIS — S73191D Other sprain of right hip, subsequent encounter: Secondary | ICD-10-CM

## 2024-12-09 ENCOUNTER — Encounter: Admitting: Rehabilitative and Restorative Service Providers"

## 2024-12-22 ENCOUNTER — Encounter (HOSPITAL_BASED_OUTPATIENT_CLINIC_OR_DEPARTMENT_OTHER): Admitting: Rehabilitative and Restorative Service Providers"

## 2024-12-31 ENCOUNTER — Encounter: Admitting: Rehabilitative and Restorative Service Providers"

## 2024-12-31 ENCOUNTER — Encounter

## 2025-01-07 ENCOUNTER — Encounter (HOSPITAL_BASED_OUTPATIENT_CLINIC_OR_DEPARTMENT_OTHER)

## 2025-01-14 ENCOUNTER — Encounter (HOSPITAL_BASED_OUTPATIENT_CLINIC_OR_DEPARTMENT_OTHER)

## 2025-01-21 ENCOUNTER — Encounter (HOSPITAL_BASED_OUTPATIENT_CLINIC_OR_DEPARTMENT_OTHER)
